# Patient Record
Sex: Female | Born: 2005 | Race: Black or African American | Hispanic: No | Marital: Single | Smoking: Never smoker
Health system: Southern US, Community
[De-identification: ages and names within clinical notes are randomized; demographics above are authoritative.]

## PROBLEM LIST (undated history)

## (undated) DIAGNOSIS — D5700 Hb-SS disease with crisis, unspecified: Secondary | ICD-10-CM

## (undated) HISTORY — PX: APPENDECTOMY: SHX54

## (undated) HISTORY — PX: SPLENECTOMY, TOTAL: SHX788

## (undated) HISTORY — PX: GALLBLADDER SURGERY: SHX652

---
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
## 2023-02-20 ENCOUNTER — Other Ambulatory Visit (HOSPITAL_COMMUNITY)
Admission: RE | Admit: 2023-02-20 | Discharge: 2023-02-20 | Disposition: A | Discharge status: Home / Self Care | Payer: Medicaid Other | Source: Ambulatory Visit | Attending: Family | Admitting: Family

## 2023-02-20 ENCOUNTER — Ambulatory Visit (INDEPENDENT_AMBULATORY_CARE_PROVIDER_SITE_OTHER): Payer: Medicaid Other | Admitting: Family

## 2023-02-20 ENCOUNTER — Encounter: Payer: Self-pay | Admitting: Family

## 2023-02-20 ENCOUNTER — Encounter: Payer: Self-pay | Admitting: Pediatrics

## 2023-02-20 VITALS — BP 112/72 | HR 96 | Ht 61.81 in | Wt 129.2 lb

## 2023-02-20 DIAGNOSIS — Z3202 Encounter for pregnancy test, result negative: Secondary | ICD-10-CM | POA: Diagnosis not present

## 2023-02-20 DIAGNOSIS — Z113 Encounter for screening for infections with a predominantly sexual mode of transmission: Secondary | ICD-10-CM | POA: Insufficient documentation

## 2023-02-20 DIAGNOSIS — Z3046 Encounter for surveillance of implantable subdermal contraceptive: Secondary | ICD-10-CM | POA: Diagnosis not present

## 2023-02-20 DIAGNOSIS — N921 Excessive and frequent menstruation with irregular cycle: Secondary | ICD-10-CM | POA: Diagnosis not present

## 2023-02-20 DIAGNOSIS — Z975 Presence of (intrauterine) contraceptive device: Secondary | ICD-10-CM

## 2023-02-20 LAB — POCT URINE PREGNANCY: Preg Test, Ur: NEGATIVE

## 2023-02-20 MED ORDER — ETONOGESTREL 68 MG ~~LOC~~ IMPL
68.0000 mg | DRUG_IMPLANT | Freq: Once | SUBCUTANEOUS | Status: AC
  Administered 2023-02-20: 68 mg via SUBCUTANEOUS

## 2023-02-20 NOTE — Progress Notes (Signed)
History was provided by the patient. Mother on phone.   Janet Stone is a 17 y.o. female who is here for breakthrough bleeding with implant; requesting removal and replacement. Nexplanon insertion 03/2020.   PCP confirmed? 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   Sickle cell crisis (HCC)    SOB (shortness of breath)    School problem 03/27/2015   Fever, unspecified    Wheezing    Sickle cell disease, type SS (HCC)    Hb-SS disease with crisis (HCC)    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  2   Oxycodone HCl 10 MG TABS Take 1 tablet (10 mg total) by mouth every 6 (six) hours as needed (pain). 20 tablet 0   penicillin v potassium (VEETID) 250 MG tablet Take 250 mg by mouth 2 times daily at 12 noon and 4 pm.     acetaminophen (TYLENOL) 500 MG tablet Take 500 mg by mouth 2 (two) times daily as needed for moderate pain, fever or headache. (Patient not taking: Reported on 02/20/2023)      acetaminophen-codeine (TYLENOL #3) 300-30 MG tablet Take 1 tablet by mouth every 4 (four) hours as needed for moderate pain. (Patient not taking: Reported on 02/20/2023)     amoxicillin-clavulanate (AUGMENTIN) 250-125 MG tablet Take 1 tablet by mouth 3 (three) times daily. (Patient not taking: Reported on 02/20/2023)     ENDARI 5 g PACK Powder Packet Take 5 g by mouth in the morning, at noon, and at bedtime. (Patient not taking: Reported on 06/04/2022) 180 each 11   ibuprofen (ADVIL) 200 MG tablet Take 600 mg by mouth 2 (two) times daily as needed for moderate pain. (Patient not taking: Reported on 02/20/2023)     naloxone (NARCAN) nasal spray 4 mg/0.1 mL Place 1 spray into the nose once. (Patient not taking: Reported on 02/20/2023)     polyethylene glycol (MIRALAX) 17 g packet Take 17 g by mouth 2 (two) times daily. (Patient not taking: Reported on 02/20/2023) 30 each 0   No current facility-administered medications on file prior to visit.    No Known Allergies  Physical Exam:    Vitals:   02/20/23 1011  BP: 112/72  Pulse: 96  Weight: 129 lb 3.2 oz (58.6 kg)  Height: 5' 1.81" (1.57 m)    Blood pressure %iles are not available for patients who are 18 years or older. No LMP recorded.  Physical  Exam Constitutional:      General: She is not in acute distress.    Appearance: She is well-developed.  HENT:     Head: Normocephalic and atraumatic.  Eyes:     General: No scleral icterus.    Pupils: Pupils are equal, round, and reactive to light.  Neck:     Thyroid: No thyromegaly.  Cardiovascular:     Rate and Rhythm: Normal rate and regular rhythm.     Heart sounds: Normal heart sounds. No murmur heard. Pulmonary:     Effort: Pulmonary effort is normal.     Breath sounds: Normal breath sounds.  Abdominal:     Palpations: Abdomen is soft.  Musculoskeletal:        General: Normal range of motion.     Cervical back: Normal range of motion and neck supple.  Lymphadenopathy:      Cervical: No cervical adenopathy.  Skin:    General: Skin is warm and dry.     Findings: No rash.     Comments: New implant palpable in correct position LUE   Neurological:     Mental Status: She is alert and oriented to person, place, and time.     Cranial Nerves: No cranial nerve deficit.  Psychiatric:        Behavior: Behavior normal.        Thought Content: Thought content normal.        Judgment: Judgment normal.      Assessment/Plan: 1. Breakthrough bleeding on Nexplanon (Primary) 2. Encounter for removal and reinsertion of Nexplanon Risks & benefits of Nexplanon removal discussed. Consent form signed.  The patient denies any allergies to anesthetics or antiseptics.  Procedure: Pt was placed in supine position. left arm was flexed at the elbow and externally rotated so that her wrist was parallel to her ear, The device was palpated and marked. The site was cleaned with Betadine. The area surrounding the device was covered with a sterile drape. 1% lidocaine was injected just under the device. A scalpel was used to create a small incision. The device was pushed towards the incision. Fibrous tissue surrounding the device was gradually removed from the device. The device was removed and measured to ensure all 4 cm of device was removed.  Nexplanon Reinsertion % lidocaine was injected just under the skin at the insertion site extending 4 cm proximally. The sterile preloaded disposable Nexaplanon applicator was removed from the sterile packaging The applicator needle was inserted at a 30 degree angle at 8 cm proximal to the medial epicondyle as marked The applicator was lowered to a horizontal position and advanced just under the skin for the full length of the needle The slider on the applicator was retracted fully while the applicator remained in the same position, then the applicator was removed. The implant was confirmed via palpation as being in position The implant  position was demonstrated to the patient Pressure dressing was applied to the patient.  The patient was instructed to removed the pressure dressing in 24 hrs.  The patient was advised to move slowly from a supine to an upright position  The patient denied any concerns or complaints  The patient was instructed to schedule a follow-up appt in 1 month and to call sooner if any concerns.  The patient acknowledged agreement and understanding of the plan.   - etonogestrel (NEXPLANON) implant 68 mg - Subdermal Etonogestrel Implant Insertion  3. Routine screening for STI (sexually transmitted infection) - Urine cytology ancillary only -  WET PREP BY MOLECULAR PROBE  4. Pregnancy examination or test, negative result - POCT urine pregnancy  Follow up in one month, virtual OK.

## 2023-02-20 NOTE — Patient Instructions (Signed)
Follow-up  in 1 month. Schedule this appointment before you leave clinic today.  Congratulations on getting your Nexplanon placement!  Below is some important information about Nexplanon.  First remember that Nexplanon does not prevent sexually transmitted infections.  Condoms will help prevent sexually transmitted infections. The Nexplanon starts working 7 days after it was inserted.  There is a risk of getting pregnant if you have unprotected sex in those first 7 days after placement of the Nexplanon.  The Nexplanon lasts for 3 years but can be removed at any time.  You can become pregnant as early as 1 week after removal.  You can have a new Nexplanon put in after the old one is removed if you like.  It is not known whether Nexplanon is as effective in women who are very overweight because the studies did not include many overweight women.  Nexplanon interacts with some medications, including barbiturates, bosentan, carbamazepine, felbamate, griseofulvin, oxcarbazepine, phenytoin, rifampin, St. John's wort, topiramate, HIV medicines.  Please alert your doctor if you are on any of these medicines.  Always tell other healthcare providers that you have a Nexplanon in your arm.  The Nexplanon was placed just under the skin.  Leave the outside bandage on for 24 hours.  Leave the smaller bandage on for 3-5 days or until it falls off on its own.  Keep the area clean and dry for 3-5 days. There is usually bruising or swelling at the insertion site for a few days to a week after placement.  If you see redness or pus draining from the insertion site, call us immediately.  Keep your user card with the date the implant was placed and the date the implant is to be removed.  The most common side effect is a change in your menstrual bleeding pattern.   This bleeding is generally not harmful to you but can be annoying.  Call or come in to see us if you have any concerns about the bleeding or if you have any  side effects or questions.    We will call you in 1 week to check in and we would like you to return to the clinic for a follow-up visit in 1 month.  You can call [facility] 24 hours a day with any questions or concerns.  There is always a nurse or doctor available to take your call.  Call 9-1-1 if you have a life-threatening emergency.  For anything else, please call us at [phone] before heading to the ER. 

## 2023-02-21 ENCOUNTER — Other Ambulatory Visit: Payer: Self-pay | Admitting: Family

## 2023-02-21 LAB — URINE CYTOLOGY ANCILLARY ONLY
Bacterial Vaginitis-Urine: NEGATIVE
Candida Urine: NEGATIVE
Chlamydia: NEGATIVE
Comment: NEGATIVE
Comment: NEGATIVE
Comment: NORMAL
Neisseria Gonorrhea: NEGATIVE
Trichomonas: POSITIVE — AB

## 2023-02-21 LAB — WET PREP BY MOLECULAR PROBE
Candida species: NOT DETECTED
Gardnerella vaginalis: NOT DETECTED
MICRO NUMBER:: 15871844
SPECIMEN QUALITY:: ADEQUATE
Trichomonas vaginosis: DETECTED — AB

## 2023-02-21 MED ORDER — METRONIDAZOLE 500 MG PO TABS: 2000.0000 mg | ORAL_TABLET | Freq: Once | ORAL | 0 refills | Status: AC

## 2023-03-22 ENCOUNTER — Emergency Department (HOSPITAL_COMMUNITY)
Admission: EM | Admit: 2023-03-22 | Discharge: 2023-03-22 | Disposition: A | Discharge status: Home / Self Care | Payer: Medicaid Other | Attending: Emergency Medicine | Admitting: Emergency Medicine

## 2023-03-22 ENCOUNTER — Emergency Department (HOSPITAL_COMMUNITY): Payer: Medicaid Other

## 2023-03-22 ENCOUNTER — Other Ambulatory Visit: Payer: Self-pay

## 2023-03-22 ENCOUNTER — Encounter (HOSPITAL_COMMUNITY): Payer: Self-pay

## 2023-03-22 DIAGNOSIS — D5700 Hb-SS disease with crisis, unspecified: Secondary | ICD-10-CM

## 2023-03-22 DIAGNOSIS — M545 Low back pain, unspecified: Secondary | ICD-10-CM | POA: Insufficient documentation

## 2023-03-22 DIAGNOSIS — N3001 Acute cystitis with hematuria: Secondary | ICD-10-CM | POA: Diagnosis not present

## 2023-03-22 DIAGNOSIS — D57219 Sickle-cell/Hb-C disease with crisis, unspecified: Secondary | ICD-10-CM | POA: Insufficient documentation

## 2023-03-22 DIAGNOSIS — R0789 Other chest pain: Secondary | ICD-10-CM | POA: Diagnosis present

## 2023-03-22 LAB — COMPREHENSIVE METABOLIC PANEL
ALT: 12 U/L (ref 0–44)
AST: 23 U/L (ref 15–41)
Albumin: 4.7 g/dL (ref 3.5–5.0)
Alkaline Phosphatase: 30 U/L — ABNORMAL LOW (ref 38–126)
Anion gap: 8 (ref 5–15)
BUN: 7 mg/dL (ref 6–20)
CO2: 25 mmol/L (ref 22–32)
Calcium: 9.5 mg/dL (ref 8.9–10.3)
Chloride: 103 mmol/L (ref 98–111)
Creatinine, Ser: 0.64 mg/dL (ref 0.44–1.00)
GFR, Estimated: >60 mL/min (ref >60)
Glucose, Bld: 87 mg/dL (ref 70–99)
Potassium: 3.6 mmol/L (ref 3.5–5.1)
Sodium: 136 mmol/L (ref 135–145)
Total Bilirubin: 2.4 mg/dL — ABNORMAL HIGH (ref 0.0–1.2)
Total Protein: 7.7 g/dL (ref 6.5–8.1)

## 2023-03-22 LAB — URINALYSIS, ROUTINE W REFLEX MICROSCOPIC
Bilirubin Urine: NEGATIVE
Glucose, UA: NEGATIVE mg/dL
Ketones, ur: NEGATIVE mg/dL
Nitrite: POSITIVE — AB
Protein, ur: 30 mg/dL — AB
Specific Gravity, Urine: 1.013 (ref 1.005–1.030)
pH: 6 (ref 5.0–8.0)

## 2023-03-22 LAB — CBC WITH DIFFERENTIAL/PLATELET
Abs Immature Granulocytes: 0.02 10*3/uL (ref 0.00–0.07)
Basophils Absolute: 0 10*3/uL (ref 0.0–0.1)
Basophils Relative: 0 %
Eosinophils Absolute: 0.1 10*3/uL (ref 0.0–0.5)
Eosinophils Relative: 2 %
HCT: 29.8 % — ABNORMAL LOW (ref 36.0–46.0)
Hemoglobin: 10.2 g/dL — ABNORMAL LOW (ref 12.0–15.0)
Immature Granulocytes: 0 %
Lymphocytes Relative: 27 %
Lymphs Abs: 1.9 10*3/uL (ref 0.7–4.0)
MCH: 30.7 pg (ref 26.0–34.0)
MCHC: 34.2 g/dL (ref 30.0–36.0)
MCV: 89.8 fL (ref 80.0–100.0)
Monocytes Absolute: 0.9 10*3/uL (ref 0.1–1.0)
Monocytes Relative: 12 %
Neutro Abs: 4.1 10*3/uL (ref 1.7–7.7)
Neutrophils Relative %: 59 %
Platelets: 417 10*3/uL — ABNORMAL HIGH (ref 150–400)
RBC: 3.32 MIL/uL — ABNORMAL LOW (ref 3.87–5.11)
RDW: 13.7 % (ref 11.5–15.5)
WBC: 7.1 10*3/uL (ref 4.0–10.5)
nRBC: 0 % (ref 0.0–0.2)

## 2023-03-22 LAB — RETICULOCYTES
Immature Retic Fract: 24.9 % — ABNORMAL HIGH (ref 2.3–15.9)
RBC.: 3.28 MIL/uL — ABNORMAL LOW (ref 3.87–5.11)
Retic Count, Absolute: 123 10*3/uL (ref 19.0–186.0)
Retic Ct Pct: 3.8 % — ABNORMAL HIGH (ref 0.4–3.1)

## 2023-03-22 LAB — HCG, SERUM, QUALITATIVE: Preg, Serum: NEGATIVE

## 2023-03-22 MED ORDER — DIPHENHYDRAMINE HCL 25 MG PO CAPS
25.0000 mg | ORAL_CAPSULE | Freq: Once | ORAL | Status: AC
  Administered 2023-03-22: 25 mg via ORAL
  Filled 2023-03-22: qty 1

## 2023-03-22 MED ORDER — ONDANSETRON HCL 4 MG/2ML IJ SOLN
4.0000 mg | INTRAMUSCULAR | Status: DC | PRN
  Filled 2023-03-22: qty 2

## 2023-03-22 MED ORDER — MORPHINE SULFATE (PF) 4 MG/ML IV SOLN: 8.0000 mg | INTRAVENOUS | Status: DC

## 2023-03-22 MED ORDER — HYDROMORPHONE HCL 2 MG/ML IJ SOLN
2.0000 mg | Freq: Once | INTRAMUSCULAR | Status: AC
  Administered 2023-03-22: 2 mg via INTRAVENOUS
  Filled 2023-03-22: qty 1

## 2023-03-22 MED ORDER — SODIUM CHLORIDE 0.9 % IV SOLN
12.5000 mg | Freq: Four times a day (QID) | INTRAVENOUS | Status: DC | PRN
  Administered 2023-03-22: 12.5 mg via INTRAVENOUS
  Filled 2023-03-22: qty 12.5

## 2023-03-22 MED ORDER — MORPHINE SULFATE (PF) 4 MG/ML IV SOLN
6.0000 mg | INTRAVENOUS | Status: AC
  Administered 2023-03-22: 6 mg via INTRAVENOUS
  Filled 2023-03-22: qty 2

## 2023-03-22 MED ORDER — OXYCODONE-ACETAMINOPHEN 5-325 MG PO TABS: 1.0000 | ORAL_TABLET | Freq: Four times a day (QID) | ORAL | 0 refills | Status: DC | PRN

## 2023-03-22 MED ORDER — CEPHALEXIN 500 MG PO CAPS: 500.0000 mg | ORAL_CAPSULE | Freq: Four times a day (QID) | ORAL | 0 refills | Status: DC

## 2023-03-22 NOTE — ED Triage Notes (Signed)
Pt reports with SCC since yesterday with pain to her lower back and chest. Pt last took naproxen at 0800.

## 2023-03-22 NOTE — ED Provider Notes (Signed)
Janet Stone Provider Note   CSN: [id number] Arrival date & time: 03/22/23  1048     History  Chief Complaint  Patient presents with   Sickle Cell Pain Crisis    Janet Stone is a 18 y.o. female with past medical history of sickle cell disease, acute chest syndrome, splenic torsion crisis presenting to emergency room with sickle cell pain crisis that started yesterday.  Patient reports the pain feels like her typical sickle cell pain crisis.  Reports she did not have any injury or fall.  Primarily reporting low back pain, bilaterally.  Pain is worse with movement.  No radicular symptoms, loss of bowel or bladder or saddle anesthesia.  Patient also having chest tightness located in anterior chest worse when taking deep breath then.  Denies any unilateral leg swelling or calf tenderness.  Has not had infectious symptoms or cough, denies fever or chills.  Reports taking oxycodone and naproxen at home for pain control, without improvement.   Sickle Cell Pain Crisis Associated symptoms: chest pain        Home Medications Prior to Admission medications   Medication Sig Start Date End Date Taking? Authorizing Provider  acetaminophen (TYLENOL) 500 MG tablet Take 500 mg by mouth 2 (two) times daily as needed for moderate pain, fever or headache. Patient not taking: Reported on 02/20/2023    [provider]  acetaminophen-codeine (TYLENOL #3) 300-30 MG tablet Take 1 tablet by mouth every 4 (four) hours as needed for moderate pain. Patient not taking: Reported on 02/20/2023 11/21/22   [provider]  amoxicillin-clavulanate (AUGMENTIN) 250-125 MG tablet Take 1 tablet by mouth 3 (three) times daily. Patient not taking: Reported on 02/20/2023 11/20/22   [provider]  ENDARI 5 g PACK Powder Packet Take 5 g by mouth in the morning, at noon, and at bedtime. Patient not taking: Reported on 06/04/2022 04/24/19   Anastasia Pall, MD  hydroxyurea (HYDREA) 500 MG capsule Take 500 mg by mouth 2 (two) times daily. 06/13/17   [provider]  ibuprofen (ADVIL) 200 MG tablet Take 600 mg by mouth 2 (two) times daily as needed for moderate pain. Patient not taking: Reported on 02/20/2023    [provider]  naloxone [facility]) nasal spray 4 mg/0.1 mL Place 1 spray into the nose once. Patient not taking: Reported on 02/20/2023 11/15/22   [provider]  Oxycodone HCl 10 MG TABS Take 1 tablet (10 mg total) by mouth every 6 (six) hours as needed (pain). 12/02/22   Massie Maroon, FNP  penicillin v potassium (VEETID) 250 MG tablet Take 250 mg by mouth 2 times daily at 12 noon and 4 pm. 04/10/16   [provider]  polyethylene glycol (MIRALAX) 17 g packet Take 17 g by mouth 2 (two) times daily. Patient not taking: Reported on 02/20/2023 06/09/22   Krystal Clark, MD      Allergies    Patient has no known allergies.    Review of Systems   Review of Systems  Cardiovascular:  Positive for chest pain.    Physical Exam Updated Vital Signs BP 117/73 (BP Location: Left Arm)   Pulse (!) 106   Temp 98.4 F (36.9 C) (Oral)   Resp 16   Ht 5\' 2"  (1.575 m)   Wt 58.6 kg   SpO2 100%   BMI 23.63 kg/m  Physical Exam Vitals and nursing note reviewed.  Constitutional:      General:  She is not in acute distress.    Appearance: She is not toxic-appearing.  HENT:     Head: Normocephalic and atraumatic.  Eyes:     General: No scleral icterus.    Conjunctiva/sclera: Conjunctivae normal.  Cardiovascular:     Rate and Rhythm: Normal rate and regular rhythm.     Pulses: Normal pulses.     Heart sounds: Normal heart sounds.  Pulmonary:     Effort: Pulmonary effort is normal. No respiratory distress.     Breath sounds: Normal breath sounds.  Abdominal:     General: Abdomen is flat. Bowel sounds are normal.     Palpations: Abdomen is soft.     Tenderness: There is no abdominal tenderness.   Musculoskeletal:     Right lower leg: No edema.     Left lower leg: No edema.  Skin:    General: Skin is warm and dry.     Findings: No lesion.  Neurological:     General: No focal deficit present.     Mental Status: She is alert and oriented to person, place, and time. Mental status is at baseline.     ED Results / Procedures / Treatments   Labs (all labs ordered are listed, but only abnormal results are displayed) Labs Reviewed  COMPREHENSIVE METABOLIC PANEL  CBC WITH DIFFERENTIAL/PLATELET  RETICULOCYTES  HCG, SERUM, QUALITATIVE  URINALYSIS, ROUTINE W REFLEX MICROSCOPIC    EKG None  Radiology No results found.  Procedures Procedures    Medications Ordered in ED Medications  ondansetron (ZOFRAN) injection 4 mg (has no administration in time range)  diphenhydrAMINE (BENADRYL) 12.5 mg in sodium chloride 0.9 % 50 mL IVPB (has no administration in time range)  morphine (PF) 4 MG/ML injection 6 mg (has no administration in time range)    ED Course/ Medical Decision Making/ A&P                                 Medical Decision Making Amount and/or Complexity of Data Reviewed Labs: ordered. Radiology: ordered.  Risk Prescription drug management.   Janet Stone 18 y.o. presented today for chest pain. Working DDx that I considered at this time includes, but not limited to, ACS, GERD, pe, pna, aortic dissection, pneumothorax, MSK path, anemia, esophageal rupture, CHF exacerbation, valvular disorder, myocarditis, pericarditis, endocarditis, pericardial effusion/cardiac tamponade, pulmonary edema, gastritis/PUD, esophagitis.  R/o Dx: These are considered less likely due to history of present illness and physical exam findings.  Review of prior external notes: None   Unique Tests and My Interpretation:  EKG: Rate, rhythm, axis, intervals all examined: sinus tachycardia   CXR: no acute findings  CBC: No leukocytosis, hemoglobin stable at 10.2 BMP: No electrolyte  abnormality HCG: Negative Reticulocyte: No significant findings UA: Positive nitrates, positive leukocytes, bacteria  Problem List / ED Course / Critical interventions / Medication management  Reporting to emergency room with complaint of chest tightness and low back pain.  Patient does also have some suprapubic pain.  Ordered urinalysis to evaluate for urinary like symptoms which shows urinary tract infection.  Patient's chest tightness and low back pain feels like typical sickle cell pain.  Patient is PERC negative.  EKG without acute abnormality.  Chest x-ray without acute abnormality.  No infectious symptoms hemodynamically stable and not hypoxic.  Pain has improved after IV pain medication.  And patient is comfortable going home and using oral pain medications.  Lab  work consistent with prior labs.  Patient has follow-up with his sickle cell team. Stable for discharge.  I ordered medication including Morphine, Nausea  for pain  Reevaluation of the patient after these medicines showed that the patient improved Patients vitals assessed. Upon arrival patient is hemodynamically stable.  I have reviewed the patients home medicines and have made adjustments as needed     Plan: UTI treatment with antibiotics Follow-up with primary care, follow-up with sickle cell disease Stable for discharge         Final Clinical Impression(s) / ED Diagnoses Final diagnoses:  Sickle cell pain crisis (HCC)  Acute cystitis with hematuria    Rx / DC Orders ED Discharge Orders     None         Smitty Knudsen, PA-C 03/22/23 1820    Loetta Rough, MD 03/23/23 [phone]

## 2023-03-22 NOTE — Discharge Instructions (Addendum)
You were seen in the emergency room today for sickle cell pain crisis.  Your EKG, chest x-ray and labs are reassuring.  Please follow-up with your primary care doctor to ensure symptoms are improving.  Please return to emergency room if you have any any new or worsening symptoms.  Your urinalysis does show urinary tract infection thus I sent Keflex which is an antibiotic.  Please make sure you are staying well-hydrated.

## 2023-03-24 ENCOUNTER — Encounter: Payer: Self-pay | Admitting: Family

## 2023-03-24 ENCOUNTER — Telehealth: Payer: Medicaid Other | Admitting: Family

## 2023-03-24 DIAGNOSIS — Z975 Presence of (intrauterine) contraceptive device: Secondary | ICD-10-CM

## 2023-03-24 NOTE — Progress Notes (Signed)
Link sent to number on file x 15 minutes; patient not seen. Closed for admin purposes.

## 2023-04-10 ENCOUNTER — Encounter: Payer: Medicaid Other | Admitting: Family

## 2023-04-15 ENCOUNTER — Encounter: Payer: Medicaid Other | Admitting: Family

## 2023-04-16 ENCOUNTER — Other Ambulatory Visit: Payer: Self-pay

## 2023-04-16 ENCOUNTER — Emergency Department (HOSPITAL_COMMUNITY)
Admission: EM | Admit: 2023-04-16 | Discharge: 2023-04-16 | Disposition: A | Discharge status: Home / Self Care | Payer: Medicaid Other | Attending: Student | Admitting: Student

## 2023-04-16 ENCOUNTER — Encounter (HOSPITAL_COMMUNITY): Payer: Self-pay

## 2023-04-16 DIAGNOSIS — Z8616 Personal history of COVID-19: Secondary | ICD-10-CM | POA: Insufficient documentation

## 2023-04-16 DIAGNOSIS — D5700 Hb-SS disease with crisis, unspecified: Secondary | ICD-10-CM | POA: Insufficient documentation

## 2023-04-16 LAB — RETICULOCYTES
Immature Retic Fract: 22.7 % — ABNORMAL HIGH (ref 2.3–15.9)
RBC.: 3.37 MIL/uL — ABNORMAL LOW (ref 3.87–5.11)
Retic Count, Absolute: 124.7 10*3/uL (ref 19.0–186.0)
Retic Ct Pct: 3.7 % — ABNORMAL HIGH (ref 0.4–3.1)

## 2023-04-16 LAB — CBC WITH DIFFERENTIAL/PLATELET
Abs Immature Granulocytes: 0.03 10*3/uL (ref 0.00–0.07)
Basophils Absolute: 0 10*3/uL (ref 0.0–0.1)
Basophils Relative: 0 %
Eosinophils Absolute: 0.2 10*3/uL (ref 0.0–0.5)
Eosinophils Relative: 2 %
HCT: 29.5 % — ABNORMAL LOW (ref 36.0–46.0)
Hemoglobin: 10.4 g/dL — ABNORMAL LOW (ref 12.0–15.0)
Immature Granulocytes: 0 %
Lymphocytes Relative: 25 %
Lymphs Abs: 1.9 10*3/uL (ref 0.7–4.0)
MCH: 31.5 pg (ref 26.0–34.0)
MCHC: 35.3 g/dL (ref 30.0–36.0)
MCV: 89.4 fL (ref 80.0–100.0)
Monocytes Absolute: 0.7 10*3/uL (ref 0.1–1.0)
Monocytes Relative: 10 %
Neutro Abs: 4.7 10*3/uL (ref 1.7–7.7)
Neutrophils Relative %: 63 %
Platelets: 423 10*3/uL — ABNORMAL HIGH (ref 150–400)
RBC: 3.3 MIL/uL — ABNORMAL LOW (ref 3.87–5.11)
RDW: 14 % (ref 11.5–15.5)
WBC: 7.5 10*3/uL (ref 4.0–10.5)
nRBC: 0 % (ref 0.0–0.2)

## 2023-04-16 LAB — COMPREHENSIVE METABOLIC PANEL
ALT: 12 U/L (ref 0–44)
AST: 23 U/L (ref 15–41)
Albumin: 5.1 g/dL — ABNORMAL HIGH (ref 3.5–5.0)
Alkaline Phosphatase: 32 U/L — ABNORMAL LOW (ref 38–126)
Anion gap: 7 (ref 5–15)
BUN: 7 mg/dL (ref 6–20)
CO2: 24 mmol/L (ref 22–32)
Calcium: 9.7 mg/dL (ref 8.9–10.3)
Chloride: 106 mmol/L (ref 98–111)
Creatinine, Ser: 0.5 mg/dL (ref 0.44–1.00)
GFR, Estimated: >60 mL/min (ref >60)
Glucose, Bld: 90 mg/dL (ref 70–99)
Potassium: 3.9 mmol/L (ref 3.5–5.1)
Sodium: 137 mmol/L (ref 135–145)
Total Bilirubin: 2 mg/dL — ABNORMAL HIGH (ref 0.0–1.2)
Total Protein: 8.1 g/dL (ref 6.5–8.1)

## 2023-04-16 LAB — HCG, SERUM, QUALITATIVE: Preg, Serum: NEGATIVE

## 2023-04-16 MED ORDER — ONDANSETRON HCL 4 MG/2ML IJ SOLN
4.0000 mg | Freq: Once | INTRAMUSCULAR | Status: AC
Start: 2023-04-16 — End: 2023-04-16
  Administered 2023-04-16: 4 mg via INTRAVENOUS
  Filled 2023-04-16: qty 2

## 2023-04-16 MED ORDER — LACTATED RINGERS IV BOLUS
1000.0000 mL | Freq: Once | INTRAVENOUS | Status: AC
  Administered 2023-04-16: 1000 mL via INTRAVENOUS

## 2023-04-16 MED ORDER — KETOROLAC TROMETHAMINE 15 MG/ML IJ SOLN
15.0000 mg | Freq: Once | INTRAMUSCULAR | Status: AC
  Administered 2023-04-16: 15 mg via INTRAVENOUS
  Filled 2023-04-16: qty 1

## 2023-04-16 MED ORDER — HYDROMORPHONE HCL 1 MG/ML IJ SOLN
1.0000 mg | Freq: Once | INTRAMUSCULAR | Status: AC
  Administered 2023-04-16: 1 mg via INTRAVENOUS
  Filled 2023-04-16: qty 1

## 2023-04-16 MED ORDER — DIPHENHYDRAMINE HCL 50 MG/ML IJ SOLN
25.0000 mg | Freq: Once | INTRAMUSCULAR | Status: AC
  Administered 2023-04-16: 25 mg via INTRAVENOUS
  Filled 2023-04-16: qty 1

## 2023-04-16 MED ORDER — OXYCODONE HCL 5 MG PO CAPS: 10.0000 mg | ORAL_CAPSULE | Freq: Four times a day (QID) | ORAL | 0 refills | Status: DC | PRN

## 2023-04-16 MED ORDER — MORPHINE SULFATE (PF) 4 MG/ML IV SOLN
6.0000 mg | Freq: Once | INTRAVENOUS | Status: AC
  Administered 2023-04-16: 6 mg via INTRAVENOUS
  Filled 2023-04-16: qty 2

## 2023-04-16 MED ORDER — IBUPROFEN 600 MG PO TABS: 600.0000 mg | ORAL_TABLET | Freq: Three times a day (TID) | ORAL | 0 refills | Status: AC | PRN

## 2023-04-16 NOTE — ED Provider Notes (Signed)
[place] EMERGENCY DEPARTMENT AT [facility] Provider Note  CSN: [id number] Arrival date & time: 04/16/23 1112  Chief Complaint(s) No chief complaint on file.  HPI Janet Stone is a 18 y.o. female with PMH sickle cell anemia who presents Emergency Department for evaluation of arm pain and sickle cell pain crisis.  States that pain began last night.  Has been compliant with her home oxycodone without improvement.  Denies associated chest pain, shortness of breath, abdominal pain, nausea, vomiting or other systemic symptoms.   Past Medical History Past Medical History:  Diagnosis Date   Sickle cell anemia with crisis [facility])    Patient Active Problem List   Diagnosis Date Noted   Hyperkalemia 11/26/2022   Sickle cell anemia (HCC) 04/12/2020   Constipation due to opioid therapy 04/12/2020   COVID-19 04/23/2019   COVID-19 virus infection 04/23/2019   Pain of left lower extremity    S/P cholecystectomy 12/02/2015   S/P appendectomy 12/02/2015   Oxygen desaturation    Acute sickle cell splenic sequestration crisis (HCC)    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   Sickle cell crisis (HCC)    SOB (shortness of breath)    School problem 03/27/2015   Fever, unspecified    Wheezing    Sickle cell disease, type SS (HCC)    Hb-SS disease with crisis (HCC)    Splenic sequestration crisis 08/11/2014   Hypoxia    Fever    Acute chest syndrome (HCC)    Adjustment disorder with other symptom    Pica 12/24/2013   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 Authorizing Provider  acetaminophen (TYLENOL) 500 MG tablet Take 500 mg by mouth 2 (two) times daily as needed for moderate pain, fever or headache. Patient not taking: Reported on 02/20/2023    [provider]  acetaminophen-codeine (TYLENOL #3) 300-30 MG tablet Take 1 tablet by mouth every 4 (four) hours as needed for moderate pain. Patient not taking: Reported on 02/20/2023 11/21/22   [provider]  amoxicillin-clavulanate (AUGMENTIN) 250-125 MG tablet Take 1 tablet by mouth 3 (three) times daily. Patient not taking: Reported on 02/20/2023 11/20/22   [provider]  cephALEXin (KEFLEX) 500 MG capsule Take 1 capsule (500 mg total) by mouth 4 (four) times daily. 03/22/23   Barrett, Horald Chestnut, PA-C  ENDARI 5 g PACK Powder Packet Take 5 g by mouth in the morning, at noon, and at bedtime. Patient not taking: Reported on 06/04/2022 04/24/19   Anastasia Pall, MD  hydroxyurea (HYDREA) 500 MG capsule Take 500 mg by mouth 2 (two) times daily. 06/13/17   [provider]  ibuprofen (ADVIL) 200 MG tablet Take 600 mg by mouth 2 (two) times daily as needed for moderate pain. Patient not taking: Reported on 02/20/2023    [provider]  naloxone [facility]) nasal spray 4 mg/0.1 mL Place 1 spray into the nose once. Patient not taking: Reported on 02/20/2023 11/15/22   [provider]  Oxycodone HCl 10 MG TABS Take 1 tablet (10 mg total) by mouth every 6 (six) hours as needed (pain). 12/02/22   Massie Maroon, FNP  oxyCODONE-acetaminophen (PERCOCET/ROXICET) 5-325 MG tablet Take 1 tablet by mouth every 6 (six) hours as needed for severe pain (pain score 7-10). 03/22/23   Barrett,  Horald Chestnut, PA-C  penicillin v potassium (VEETID) 250 MG tablet Take 250 mg by mouth 2 times daily at 12 noon and 4 pm. 04/10/16   [provider]  polyethylene glycol (MIRALAX) 17 g packet Take 17 g by mouth 2 (two) times daily. Patient not taking: Reported on 02/20/2023 06/09/22   Krystal Clark, MD                                                                                                                                    Past Surgical History Past Surgical History:  Procedure Laterality Date    APPENDECTOMY     GALLBLADDER SURGERY     SPLENECTOMY, TOTAL     Family History Family History  Problem Relation Age of Onset   Sickle cell trait Mother    Sickle cell trait Father    Asthma Brother        multiple admits to ED, given nebulizer, sent home, no home meds    Social History Social History   Tobacco Use   Smoking status: Never    Passive exposure: Never  Vaping Use   Vaping status: Never Used  Substance Use Topics   Alcohol use: No   Drug use: No   Allergies Patient has no known allergies.  Review of Systems Review of Systems  Musculoskeletal:  Positive for arthralgias and myalgias.    Physical Exam Vital Signs  I have reviewed the triage vital signs BP (!) 119/57 (BP Location: Left Arm)   Pulse 91   Temp 98.7 F (37.1 C) (Oral)   Resp 16   Ht 5\' 2"  (1.575 m)   Wt 58.6 kg   LMP 03/22/2023 (Approximate)   SpO2 100%   BMI 23.63 kg/m   Physical Exam Vitals and nursing note reviewed.  Constitutional:      General: She is not in acute distress.    Appearance: She is well-developed.  HENT:     Head: Normocephalic and atraumatic.  Eyes:     Conjunctiva/sclera: Conjunctivae normal.  Cardiovascular:     Rate and Rhythm: Normal rate and regular rhythm.     Heart sounds: No murmur heard. Pulmonary:     Effort: Pulmonary effort is normal. No respiratory distress.     Breath sounds: Normal breath sounds.  Abdominal:     Palpations: Abdomen is soft.     Tenderness: There is no abdominal tenderness.  Musculoskeletal:        General: Tenderness present. No swelling.     Cervical back: Neck supple.  Skin:    General: Skin is warm and dry.     Capillary Refill: Capillary refill takes less than 2 seconds.  Neurological:     Mental Status: She is alert.  Psychiatric:        Mood and Affect: Mood normal.     ED Results and Treatments Labs (all labs ordered are listed, but only abnormal  results are displayed) Labs Reviewed  CBC WITH  DIFFERENTIAL/PLATELET - Abnormal; Notable for the following components:      Result Value   RBC 3.30 (*)    Hemoglobin 10.4 (*)    HCT 29.5 (*)    Platelets 423 (*)    All other components within normal limits  RETICULOCYTES - Abnormal; Notable for the following components:   Retic Ct Pct 3.7 (*)    RBC. 3.37 (*)    Immature Retic Fract 22.7 (*)    All other components within normal limits  COMPREHENSIVE METABOLIC PANEL - Abnormal; Notable for the following components:   Albumin 5.1 (*)    Alkaline Phosphatase 32 (*)    Total Bilirubin 2.0 (*)    All other components within normal limits  HCG, SERUM, QUALITATIVE                                                                                                                          Radiology No results found.  Pertinent labs & imaging results that were available during my care of the patient were reviewed by me and considered in my medical decision making (see MDM for details).  Medications Ordered in ED Medications  HYDROmorphone (DILAUDID) injection 1 mg (1 mg Intravenous Given 04/16/23 1310)  ketorolac (TORADOL) 15 MG/ML injection 15 mg (15 mg Intravenous Given 04/16/23 1309)  diphenhydrAMINE (BENADRYL) injection 25 mg (25 mg Intravenous Given 04/16/23 1309)  lactated ringers bolus 1,000 mL (1,000 mLs Intravenous New Bag/Given 04/16/23 1310)                                                                                                                                     Procedures Procedures  (including critical care time)  Medical Decision Making / ED Course   This patient presents to the ED for concern of arm pain, sickle cell, this involves an extensive number of treatment options, and is a complaint that carries with it a high risk of complications and morbidity.  The differential diagnosis includes sickle cell pain crisis, chronic pain, opioid addiction, aplastic crisis  MDM: Patient seen emergency room for evaluation  of myalgias in the setting of sickle cell pain.  Physical exam with some mild tenderness over multiple muscle groups but is otherwise unremarkable.  Laboratory evaluation with a hemoglobin of 10.4, total bili 2.0 but is otherwise unremarkable.  No evidence  of aplastic crisis.  Pregnancy negative.  Patient given multiple rounds of Dilaudid as well as multimodal pain control and is currently on her third round of pain medicine with morphine per patient request.  At time of signout, patient pending reevaluation by oncoming provider.  Please see provider signout note for continuation of workup.   Additional history obtained:  -External records from outside source obtained and reviewed including: Chart review including previous notes, labs, imaging, consultation notes   Lab Tests: -I ordered, reviewed, and interpreted labs.   The pertinent results include:   Labs Reviewed  CBC WITH DIFFERENTIAL/PLATELET - Abnormal; Notable for the following components:      Result Value   RBC 3.30 (*)    Hemoglobin 10.4 (*)    HCT 29.5 (*)    Platelets 423 (*)    All other components within normal limits  RETICULOCYTES - Abnormal; Notable for the following components:   Retic Ct Pct 3.7 (*)    RBC. 3.37 (*)    Immature Retic Fract 22.7 (*)    All other components within normal limits  COMPREHENSIVE METABOLIC PANEL - Abnormal; Notable for the following components:   Albumin 5.1 (*)    Alkaline Phosphatase 32 (*)    Total Bilirubin 2.0 (*)    All other components within normal limits  HCG, SERUM, QUALITATIVE     Medicines ordered and prescription drug management: Meds ordered this encounter  Medications   HYDROmorphone (DILAUDID) injection 1 mg   ketorolac (TORADOL) 15 MG/ML injection 15 mg   diphenhydrAMINE (BENADRYL) injection 25 mg   lactated ringers bolus 1,000 mL    -I have reviewed the patients home medicines and have made adjustments as needed  Critical interventions none    Cardiac  Monitoring: The patient was maintained on a cardiac monitor.  I personally viewed and interpreted the cardiac monitored which showed an underlying rhythm of: NSR  Social Determinants of Health:  Factors impacting patients care include: none   Reevaluation: After the interventions noted above, I reevaluated the patient and found that they have :improved  Co morbidities that complicate the patient evaluation  Past Medical History:  Diagnosis Date   Sickle cell anemia with crisis (HCC)       Dispostion: I considered admission for this patient, and disposition pending reevaluation by oncoming provider.  Please see provider signout note for continuation of workup.     Final Clinical Impression(s) / ED Diagnoses Final diagnoses:  None     @PCDICTATION @    Glendora Score, MD 04/16/23 1718

## 2023-04-16 NOTE — Discharge Instructions (Addendum)
Follow-up with your sickle cell doctor.  We did write for a couple of days of pain medications until you can see your doctor for refill.  Recurrent opiate prescriptions in the future will need to come from your sickle cell doctors that see you regularly.

## 2023-04-16 NOTE — ED Provider Triage Note (Signed)
Emergency Medicine Provider Triage Evaluation Note  Janet Stone , a 18 y.o. female  was evaluated in triage.  Pt complains of right arm pain since yesterday.  States this is consistent with her sickle cell pain.  She took an oxycodone and naproxen which did not help with her pain.  Denies any chest pain or shortness of breath..  Review of Systems  Positive: As above Negative: As above  Physical Exam  BP (!) 119/57 (BP Location: Left Arm)   Pulse 91   Temp 98.7 F (37.1 C) (Oral)   Resp 16   Ht 5\' 2"  (1.575 m)   Wt 58.6 kg   LMP 03/22/2023 (Approximate)   SpO2 100%   BMI 23.63 kg/m  Gen:   Awake, no distress   Resp:  Normal effort  MSK:   Moves extremities without difficulty    Medical Decision Making  Medically screening exam initiated at 12:03 PM.  Appropriate orders placed.  [name] was informed that the remainder of the evaluation will be completed by another provider, this initial triage assessment does not replace that evaluation, and the importance of remaining in the ED until their evaluation is complete.     Arabella Merles, PA-C 04/16/23 1204

## 2023-04-16 NOTE — ED Triage Notes (Signed)
Pt arrived reporting SCC, Right arm. States the pain started last night. Took at home meds, no relief. No other symptoms  reported

## 2023-04-16 NOTE — ED Provider Notes (Signed)
Patient was initially seen by Dr. Posey Rea.  Patient states she is feeling better now and ready for discharge.  Patient is requesting a prescription for her opiate pain medications.  States if she does not get a refill she will need to be admitted.  I did review the PDMP database.  Patient's last prescription was in January and was just for a few days.  I encouraged her to follow-up with her sickle cell doctor for continued outpatient prescriptions   Linwood Dibbles, MD 04/16/23 1646

## 2023-04-21 ENCOUNTER — Encounter: Payer: Self-pay | Admitting: Family

## 2023-05-04 ENCOUNTER — Emergency Department (HOSPITAL_COMMUNITY)

## 2023-05-04 ENCOUNTER — Emergency Department (HOSPITAL_COMMUNITY)
Admission: EM | Admit: 2023-05-04 | Discharge: 2023-05-04 | Disposition: A | Discharge status: Home / Self Care | Attending: Emergency Medicine | Admitting: Emergency Medicine

## 2023-05-04 ENCOUNTER — Other Ambulatory Visit: Payer: Self-pay

## 2023-05-04 DIAGNOSIS — R0981 Nasal congestion: Secondary | ICD-10-CM | POA: Insufficient documentation

## 2023-05-04 DIAGNOSIS — J3489 Other specified disorders of nose and nasal sinuses: Secondary | ICD-10-CM | POA: Diagnosis not present

## 2023-05-04 DIAGNOSIS — M791 Myalgia, unspecified site: Secondary | ICD-10-CM | POA: Insufficient documentation

## 2023-05-04 DIAGNOSIS — J029 Acute pharyngitis, unspecified: Secondary | ICD-10-CM | POA: Insufficient documentation

## 2023-05-04 DIAGNOSIS — B349 Viral infection, unspecified: Secondary | ICD-10-CM

## 2023-05-04 LAB — COMPREHENSIVE METABOLIC PANEL
ALT: 11 U/L (ref 0–44)
AST: 22 U/L (ref 15–41)
Albumin: 4 g/dL (ref 3.5–5.0)
Alkaline Phosphatase: 31 U/L — ABNORMAL LOW (ref 38–126)
Anion gap: 8 (ref 5–15)
BUN: <5 mg/dL — ABNORMAL LOW (ref 6–20)
CO2: 23 mmol/L (ref 22–32)
Calcium: 9.2 mg/dL (ref 8.9–10.3)
Chloride: 107 mmol/L (ref 98–111)
Creatinine, Ser: 0.42 mg/dL — ABNORMAL LOW (ref 0.44–1.00)
GFR, Estimated: >60 mL/min (ref >60)
Glucose, Bld: 83 mg/dL (ref 70–99)
Potassium: 3.8 mmol/L (ref 3.5–5.1)
Sodium: 138 mmol/L (ref 135–145)
Total Bilirubin: 2 mg/dL — ABNORMAL HIGH (ref 0.0–1.2)
Total Protein: 7 g/dL (ref 6.5–8.1)

## 2023-05-04 LAB — CBC WITH DIFFERENTIAL/PLATELET
Abs Immature Granulocytes: 0.08 10*3/uL — ABNORMAL HIGH (ref 0.00–0.07)
Basophils Absolute: 0 10*3/uL (ref 0.0–0.1)
Basophils Relative: 0 %
Eosinophils Absolute: 0.2 10*3/uL (ref 0.0–0.5)
Eosinophils Relative: 1 %
HCT: 27.3 % — ABNORMAL LOW (ref 36.0–46.0)
Hemoglobin: 9.4 g/dL — ABNORMAL LOW (ref 12.0–15.0)
Immature Granulocytes: 1 %
Lymphocytes Relative: 20 %
Lymphs Abs: 3.1 10*3/uL (ref 0.7–4.0)
MCH: 30.8 pg (ref 26.0–34.0)
MCHC: 34.4 g/dL (ref 30.0–36.0)
MCV: 89.5 fL (ref 80.0–100.0)
Monocytes Absolute: 1.2 10*3/uL — ABNORMAL HIGH (ref 0.1–1.0)
Monocytes Relative: 7 %
Neutro Abs: 11.3 10*3/uL — ABNORMAL HIGH (ref 1.7–7.7)
Neutrophils Relative %: 71 %
Platelets: 368 10*3/uL (ref 150–400)
RBC: 3.05 MIL/uL — ABNORMAL LOW (ref 3.87–5.11)
RDW: 14.2 % (ref 11.5–15.5)
WBC: 15.9 10*3/uL — ABNORMAL HIGH (ref 4.0–10.5)
nRBC: 0 % (ref 0.0–0.2)

## 2023-05-04 LAB — RETICULOCYTES
Immature Retic Fract: 26.6 % — ABNORMAL HIGH (ref 2.3–15.9)
RBC.: 2.99 MIL/uL — ABNORMAL LOW (ref 3.87–5.11)
Retic Count, Absolute: 136 10*3/uL (ref 19.0–186.0)
Retic Ct Pct: 4.6 % — ABNORMAL HIGH (ref 0.4–3.1)

## 2023-05-04 LAB — RESP PANEL BY RT-PCR (RSV, FLU A&B, COVID)  RVPGX2
Influenza A by PCR: NEGATIVE
Influenza B by PCR: NEGATIVE
Resp Syncytial Virus by PCR: NEGATIVE
SARS Coronavirus 2 by RT PCR: NEGATIVE

## 2023-05-04 LAB — GROUP A STREP BY PCR: Group A Strep by PCR: NOT DETECTED

## 2023-05-04 LAB — HCG, QUANTITATIVE, PREGNANCY: hCG, Beta Chain, Quant, S: <1 m[IU]/mL (ref <5)

## 2023-05-04 MED ORDER — KETOROLAC TROMETHAMINE 15 MG/ML IJ SOLN
15.0000 mg | Freq: Once | INTRAMUSCULAR | Status: AC
  Administered 2023-05-04: 15 mg via INTRAVENOUS
  Filled 2023-05-04: qty 1

## 2023-05-04 MED ORDER — ACETAMINOPHEN 325 MG PO TABS
650.0000 mg | ORAL_TABLET | Freq: Once | ORAL | Status: AC
  Administered 2023-05-04: 650 mg via ORAL
  Filled 2023-05-04: qty 2

## 2023-05-04 MED ORDER — OXYCODONE-ACETAMINOPHEN 5-325 MG PO TABS
1.0000 | ORAL_TABLET | Freq: Once | ORAL | Status: AC
  Administered 2023-05-04: 1 via ORAL
  Filled 2023-05-04: qty 1

## 2023-05-04 MED ORDER — ACETAMINOPHEN 500 MG PO TABS: 1000.0000 mg | ORAL_TABLET | Freq: Three times a day (TID) | ORAL | 0 refills | Status: AC | PRN

## 2023-05-04 MED ORDER — BENZONATATE 100 MG PO CAPS: 100.0000 mg | ORAL_CAPSULE | Freq: Three times a day (TID) | ORAL | 0 refills | Status: DC

## 2023-05-04 NOTE — Discharge Instructions (Signed)
 Your symptoms likely due to a viral illness.  Please continue to take your home medication for symptom control.  You may alternate between Tylenol and ibuprofen as needed for aches and pain.  Take cough medication prescribed as needed for cough.  Follow-up closely with your doctor for further care.  Return if you have any concern.

## 2023-05-04 NOTE — ED Triage Notes (Signed)
 Pt present POV c/o runny nose and severe body aches onset of 5am, states "it feels different from my sickle cell pain" denies sick contacts, VSS

## 2023-05-04 NOTE — ED Provider Notes (Signed)
 Received signout from previous provider, please see her note for complete H&P.  18 year old female significant history of sickle cell disease presenting today with cold symptoms that started earlier this morning.  Symptoms including body aches, rhinorrhea, along with runny nose and sore throat.  She states it felt different from her usual sickle cell pain.  -Labs ordered, independently viewed and interpreted by me.  Labs remarkable for WBC 15.9.  Hgb 9.4.  retic ct 4.6.  strep negative, covid/flu/rsv negative -The patient was maintained on a cardiac monitor.  I personally viewed and interpreted the cardiac monitored which showed an underlying rhythm of: NSR -Imaging independently viewed and interpreted by me and I agree with radiologist's interpretation.  Result remarkable for CXR unremarkable -This patient presents to the ED for concern of cold sxs, this involves an extensive number of treatment options, and is a complaint that carries with it a high risk of complications and morbidity.  The differential diagnosis includes covid, flu, rsv, pna, viral illness, sickle cell crisis -Co morbidities that complicate the patient evaluation includes sickle cell disease -Treatment includes toradol, tylenol, percocet -Reevaluation of the patient after these medicines showed that the patient improved -PCP office notes or outside notes reviewed -Escalation to admission/observation considered: patients feels much better, is comfortable with discharge, and will follow up with PCP -Prescription medication considered, patient comfortable with home pain medication -Social Determinant of Health considered   BP 112/75   Pulse 93   Temp 98.6 F (37 C)   Ht 5\' 2"  (1.575 m)   Wt 58.6 kg   LMP 05/04/2023   SpO2 100%   BMI 23.63 kg/m   Results for orders placed or performed during the hospital encounter of 05/04/23  Resp panel by RT-PCR (RSV, Flu A&B, Covid) Anterior Nasal Swab   Collection Time: 05/04/23 11:55  AM   Specimen: Anterior Nasal Swab  Result Value Ref Range   SARS Coronavirus 2 by RT PCR NEGATIVE NEGATIVE   Influenza A by PCR NEGATIVE NEGATIVE   Influenza B by PCR NEGATIVE NEGATIVE   Resp Syncytial Virus by PCR NEGATIVE NEGATIVE  Group A Strep by PCR   Collection Time: 05/04/23  1:12 PM  Result Value Ref Range   Group A Strep by PCR NOT DETECTED NOT DETECTED  Comprehensive metabolic panel   Collection Time: 05/04/23  2:20 PM  Result Value Ref Range   Sodium 138 135 - 145 mmol/L   Potassium 3.8 3.5 - 5.1 mmol/L   Chloride 107 98 - 111 mmol/L   CO2 23 22 - 32 mmol/L   Glucose, Bld 83 70 - 99 mg/dL   BUN <5 (L) 6 - 20 mg/dL   Creatinine, Ser 4.09 (L) 0.44 - 1.00 mg/dL   Calcium 9.2 8.9 - 81.1 mg/dL   Total Protein 7.0 6.5 - 8.1 g/dL   Albumin 4.0 3.5 - 5.0 g/dL   AST 22 15 - 41 U/L   ALT 11 0 - 44 U/L   Alkaline Phosphatase 31 (L) 38 - 126 U/L   Total Bilirubin 2.0 (H) 0.0 - 1.2 mg/dL   GFR, Estimated >91 >47 mL/min   Anion gap 8 5 - 15  hCG, quantitative, pregnancy   Collection Time: 05/04/23  2:20 PM  Result Value Ref Range   hCG, Beta Chain, Quant, S <1 <5 mIU/mL  CBC with Differential/Platelet   Collection Time: 05/04/23  3:57 PM  Result Value Ref Range   WBC 15.9 (H) 4.0 - 10.5 K/uL   RBC  3.05 (L) 3.87 - 5.11 MIL/uL   Hemoglobin 9.4 (L) 12.0 - 15.0 g/dL   HCT 16.1 (L) 09.6 - 04.5 %   MCV 89.5 80.0 - 100.0 fL   MCH 30.8 26.0 - 34.0 pg   MCHC 34.4 30.0 - 36.0 g/dL   RDW 40.9 81.1 - 91.4 %   Platelets 368 150 - 400 K/uL   nRBC 0.0 0.0 - 0.2 %   Neutrophils Relative % 71 %   Neutro Abs 11.3 (H) 1.7 - 7.7 K/uL   Lymphocytes Relative 20 %   Lymphs Abs 3.1 0.7 - 4.0 K/uL   Monocytes Relative 7 %   Monocytes Absolute 1.2 (H) 0.1 - 1.0 K/uL   Eosinophils Relative 1 %   Eosinophils Absolute 0.2 0.0 - 0.5 K/uL   Basophils Relative 0 %   Basophils Absolute 0.0 0.0 - 0.1 K/uL   Immature Granulocytes 1 %   Abs Immature Granulocytes 0.08 (H) 0.00 - 0.07 K/uL    Sickle Cells PRESENT    Target Cells PRESENT   Reticulocytes   Collection Time: 05/04/23  3:57 PM  Result Value Ref Range   Retic Ct Pct 4.6 (H) 0.4 - 3.1 %   RBC. 2.99 (L) 3.87 - 5.11 MIL/uL   Retic Count, Absolute 136.0 19.0 - 186.0 K/uL   Immature Retic Fract 26.6 (H) 2.3 - 15.9 %   DG Chest 2 View Result Date: 05/04/2023 CLINICAL DATA:  Sickle cell disease, body aches, rhinorrhea EXAM: CHEST - 2 VIEW COMPARISON:  03/22/2023 FINDINGS: Frontal and lateral views of the chest demonstrate an unremarkable cardiac silhouette. No acute airspace disease, effusion, or pneumothorax. There are no acute bony abnormalities. IMPRESSION: 1. No acute intrathoracic process. Electronically Signed   By: Sharlet Salina M.D.   On: 05/04/2023 17:50       Fayrene Helper, PA-C 05/04/23 1805    Charlynne Pander, MD 05/04/23 [phone]

## 2023-05-04 NOTE — ED Provider Notes (Signed)
 [place] EMERGENCY DEPARTMENT AT [facility] Provider Note   CSN: [id number] Arrival date & time: 05/04/23  1033     History  Chief Complaint  Patient presents with   URI    CHENAY NESMITH is a 18 y.o. female with a history of sickle cell disease presents to the ED today with body aches and rhinorrhea.  Patient reports that she woke up this morning around 5 AM with bodyaches that "feels different from sickle cell pain" as well as runny nose and sore throat.  States that body aches feels similar as to when she had COVID.  Denies fevers, cough, vomiting, or diarrhea.  States that she is getting it with the larger people yesterday but denies any known sick contact.  She has not take anything for symptoms prior to arrival.  No chest pain or shortness of breath.  No additional complaints or concerns at this time.    Home Medications Prior to Admission medications   Medication Sig Start Date End Date Taking? Authorizing Provider  acetaminophen (TYLENOL) 500 MG tablet Take 500 mg by mouth 2 (two) times daily as needed for moderate pain, fever or headache. Patient not taking: Reported on 02/20/2023    [provider]  acetaminophen-codeine (TYLENOL #3) 300-30 MG tablet Take 1 tablet by mouth every 4 (four) hours as needed for moderate pain. Patient not taking: Reported on 02/20/2023 11/21/22   [provider]  amoxicillin-clavulanate (AUGMENTIN) 250-125 MG tablet Take 1 tablet by mouth 3 (three) times daily. Patient not taking: Reported on 02/20/2023 11/20/22   [provider]  cephALEXin (KEFLEX) 500 MG capsule Take 1 capsule (500 mg total) by mouth 4 (four) times daily. 03/22/23   Barrett, Horald Chestnut, PA-C  ENDARI 5 g PACK Powder Packet Take 5 g by mouth in the morning, at noon, and at bedtime. Patient not taking: Reported on 06/04/2022 04/24/19   Anastasia Pall, MD  hydroxyurea (HYDREA) 500 MG capsule Take 500 mg by mouth 2 (two) times daily. 06/13/17    [provider]  ibuprofen (IBU) 600 MG tablet Take 1 tablet (600 mg total) by mouth every 8 (eight) hours as needed for moderate pain (pain score 4-6). 04/16/23   Linwood Dibbles, MD  naloxone [facility]) nasal spray 4 mg/0.1 mL Place 1 spray into the nose once. Patient not taking: Reported on 02/20/2023 11/15/22   [provider]  oxycodone (OXY-IR) 5 MG capsule Take 2 capsules (10 mg total) by mouth every 6 (six) hours as needed. 04/16/23   Linwood Dibbles, MD  oxyCODONE-acetaminophen (PERCOCET/ROXICET) 5-325 MG tablet Take 1 tablet by mouth every 6 (six) hours as needed for severe pain (pain score 7-10). 03/22/23   Barrett, Horald Chestnut, PA-C  penicillin v potassium (VEETID) 250 MG tablet Take 250 mg by mouth 2 times daily at 12 noon and 4 pm. 04/10/16   [provider]  polyethylene glycol (MIRALAX) 17 g packet Take 17 g by mouth 2 (two) times daily. Patient not taking: Reported on 02/20/2023 06/09/22   Krystal Clark, MD      Allergies    Patient has no known allergies.    Review of Systems   Review of Systems  Constitutional:  Positive for fatigue.  HENT:  Positive for rhinorrhea.   All other systems reviewed and are negative.   Physical Exam Updated Vital Signs BP 112/75   Pulse 93   Temp 98.6 F (37 C)   Ht 5\' 2"  (1.575 m)   Wt  58.6 kg   SpO2 100%   BMI 23.63 kg/m  Physical Exam Vitals and nursing note reviewed.  Constitutional:      General: She is not in acute distress.    Appearance: Normal appearance.  HENT:     Head: Normocephalic and atraumatic.     Mouth/Throat:     Mouth: Mucous membranes are moist.     Pharynx: No oropharyngeal exudate or posterior oropharyngeal erythema.     Comments: Slightly enlarged tonsils without exudates Eyes:     Conjunctiva/sclera: Conjunctivae normal.     Pupils: Pupils are equal, round, and reactive to light.  Cardiovascular:     Rate and Rhythm: Normal rate and regular rhythm.     Pulses: Normal pulses.     Heart sounds:  Normal heart sounds.  Pulmonary:     Effort: Pulmonary effort is normal.     Breath sounds: Normal breath sounds.  Abdominal:     Palpations: Abdomen is soft.     Tenderness: There is no abdominal tenderness. There is no right CVA tenderness or left CVA tenderness.  Musculoskeletal:        General: Normal range of motion.     Cervical back: Normal range of motion.  Skin:    General: Skin is warm and dry.     Findings: No rash.  Neurological:     General: No focal deficit present.     Mental Status: She is alert.  Psychiatric:        Mood and Affect: Mood normal.        Behavior: Behavior normal.    ED Results / Procedures / Treatments   Labs (all labs ordered are listed, but only abnormal results are displayed) Labs Reviewed  COMPREHENSIVE METABOLIC PANEL - Abnormal; Notable for the following components:      Result Value   BUN <5 (*)    Creatinine, Ser 0.42 (*)    Alkaline Phosphatase 31 (*)    Total Bilirubin 2.0 (*)    All other components within normal limits  RESP PANEL BY RT-PCR (RSV, FLU A&B, COVID)  RVPGX2  GROUP A STREP BY PCR  RETICULOCYTES  CBC WITH DIFFERENTIAL/PLATELET  HCG, QUANTITATIVE, PREGNANCY  POC URINE PREG, ED    EKG None  Radiology No results found.  Procedures Procedures: not indicated.   Medications Ordered in ED Medications  acetaminophen (TYLENOL) tablet 650 mg (650 mg Oral Given 05/04/23 1237)  oxyCODONE-acetaminophen (PERCOCET/ROXICET) 5-325 MG per tablet 1 tablet (1 tablet Oral Given 05/04/23 1429)    ED Course/ Medical Decision Making/ A&P                                 Medical Decision Making Amount and/or Complexity of Data Reviewed Labs: ordered.  Risk OTC drugs. Prescription drug management.   This patient presents to the ED for concern of body aches, this involves an extensive number of treatment options, and is a complaint that carries with it a high risk of complications and morbidity.   Differential diagnosis  includes: COVID, flu, RSV, strep throat, other viral illness, sickle cell pain crisis, etc.   Comorbidities  See HPI above   Additional History  Additional history obtained from prior records   Lab Tests  I ordered and personally interpreted labs.  The pertinent results include:   Negative strep test Negative respiratory panel   Problem List / ED Course / Critical Interventions / Medication  Management  Bodyaches that began this morning with associated runny nose and sore throat. Symptoms feel similar to when she had COVID. Pain does not feel like sickle cell pain. Discussed findings with patient. She was on the phone with her mother and mother wants labs for sickle cell anemia completed. I ordered medications including: Tylenol for pain  Reevaluation of the patient after these medicines showed that the patient stayed the same. I then ordered percocet which helped the pain improve.    Social Determinants of Health  Social connection   Test / Admission - Considered  Patient care taken over by Fayrene Helper, PA-C, at shift change. Disposition pending labs and pain control.        Final Clinical Impression(s) / ED Diagnoses Final diagnoses:  None    Rx / DC Orders ED Discharge Orders     None         Maxwell Marion, PA-C 05/04/23 1533    Benjiman Core, MD 05/07/23 [phone]

## 2023-05-05 ENCOUNTER — Emergency Department (HOSPITAL_COMMUNITY)
Admission: EM | Admit: 2023-05-05 | Discharge: 2023-05-05 | Disposition: A | Discharge status: Home / Self Care | Attending: Emergency Medicine | Admitting: Emergency Medicine

## 2023-05-05 ENCOUNTER — Emergency Department (HOSPITAL_COMMUNITY)

## 2023-05-05 DIAGNOSIS — D5700 Hb-SS disease with crisis, unspecified: Secondary | ICD-10-CM | POA: Diagnosis present

## 2023-05-05 LAB — CBC WITH DIFFERENTIAL/PLATELET
Abs Immature Granulocytes: 0.06 10*3/uL (ref 0.00–0.07)
Basophils Absolute: 0 10*3/uL (ref 0.0–0.1)
Basophils Relative: 0 %
Eosinophils Absolute: 0.3 10*3/uL (ref 0.0–0.5)
Eosinophils Relative: 2 %
HCT: 28.9 % — ABNORMAL LOW (ref 36.0–46.0)
Hemoglobin: 10 g/dL — ABNORMAL LOW (ref 12.0–15.0)
Immature Granulocytes: 0 %
Lymphocytes Relative: 12 %
Lymphs Abs: 2.2 10*3/uL (ref 0.7–4.0)
MCH: 31.1 pg (ref 26.0–34.0)
MCHC: 34.6 g/dL (ref 30.0–36.0)
MCV: 89.8 fL (ref 80.0–100.0)
Monocytes Absolute: 1.1 10*3/uL — ABNORMAL HIGH (ref 0.1–1.0)
Monocytes Relative: 6 %
Neutro Abs: 14.1 10*3/uL — ABNORMAL HIGH (ref 1.7–7.7)
Neutrophils Relative %: 80 %
Platelets: 431 10*3/uL — ABNORMAL HIGH (ref 150–400)
RBC: 3.22 MIL/uL — ABNORMAL LOW (ref 3.87–5.11)
RDW: 14.2 % (ref 11.5–15.5)
WBC: 17.8 10*3/uL — ABNORMAL HIGH (ref 4.0–10.5)
nRBC: 0 % (ref 0.0–0.2)

## 2023-05-05 LAB — RETICULOCYTES
Immature Retic Fract: 26.2 % — ABNORMAL HIGH (ref 2.3–15.9)
RBC.: 3.25 MIL/uL — ABNORMAL LOW (ref 3.87–5.11)
Retic Count, Absolute: 142 10*3/uL (ref 19.0–186.0)
Retic Ct Pct: 4.4 % — ABNORMAL HIGH (ref 0.4–3.1)

## 2023-05-05 MED ORDER — SODIUM CHLORIDE 0.9 % IV BOLUS
1000.0000 mL | Freq: Once | INTRAVENOUS | Status: AC
  Administered 2023-05-05: 1000 mL via INTRAVENOUS

## 2023-05-05 MED ORDER — OXYCODONE-ACETAMINOPHEN 5-325 MG PO TABS: 2.0000 | ORAL_TABLET | ORAL | 0 refills | Status: DC | PRN

## 2023-05-05 MED ORDER — ONDANSETRON 4 MG PO TBDP
4.0000 mg | ORAL_TABLET | Freq: Once | ORAL | Status: AC
  Administered 2023-05-05: 4 mg via ORAL
  Filled 2023-05-05: qty 1

## 2023-05-05 MED ORDER — OXYCODONE-ACETAMINOPHEN 5-325 MG PO TABS: 1.0000 | ORAL_TABLET | Freq: Four times a day (QID) | ORAL | 0 refills | Status: DC | PRN

## 2023-05-05 MED ORDER — HYDROMORPHONE HCL 1 MG/ML IJ SOLN
1.0000 mg | Freq: Once | INTRAMUSCULAR | Status: AC
  Administered 2023-05-05: 1 mg via INTRAMUSCULAR
  Filled 2023-05-05: qty 1

## 2023-05-05 MED ORDER — ONDANSETRON 4 MG PO TBDP: ORAL_TABLET | ORAL | 0 refills | Status: AC

## 2023-05-05 MED ORDER — ONDANSETRON HCL 4 MG/2ML IJ SOLN
4.0000 mg | Freq: Once | INTRAMUSCULAR | Status: AC
  Administered 2023-05-05: 4 mg via INTRAVENOUS
  Filled 2023-05-05: qty 2

## 2023-05-05 MED ORDER — HYDROMORPHONE HCL 1 MG/ML IJ SOLN
0.5000 mg | Freq: Once | INTRAMUSCULAR | Status: AC
  Administered 2023-05-05: 0.5 mg via INTRAVENOUS
  Filled 2023-05-05: qty 1

## 2023-05-05 MED ORDER — KETOROLAC TROMETHAMINE 30 MG/ML IJ SOLN
30.0000 mg | Freq: Once | INTRAMUSCULAR | Status: AC
  Administered 2023-05-05: 30 mg via INTRAVENOUS
  Filled 2023-05-05: qty 1

## 2023-05-05 NOTE — ED Provider Notes (Signed)
 [place] EMERGENCY DEPARTMENT AT [facility] Provider Note   CSN: [id number] Arrival date & time: 05/05/23  4098     History {Add pertinent medical, surgical, social history, OB history to HPI:1} Chief Complaint  Patient presents with   Chest Pain   Sickle Cell Pain Crisis    Janet Stone is a 18 y.o. female.  Patient with sickle cell disease.  She complains of pain all over.  She was seen yesterday and treated but then told to take Tylenol at home   Chest Pain Sickle Cell Pain Crisis Associated symptoms: chest pain        Home Medications Prior to Admission medications   Medication Sig Start Date End Date Taking? Authorizing Provider  acetaminophen (TYLENOL) 500 MG tablet Take 2 tablets (1,000 mg total) by mouth every 8 (eight) hours as needed for fever, mild pain (pain score 1-3) or moderate pain (pain score 4-6). 05/04/23  Yes Fayrene Helper, PA-C  benzonatate (TESSALON) 100 MG capsule Take 1 capsule (100 mg total) by mouth every 8 (eight) hours. 05/04/23  Yes Fayrene Helper, PA-C  EC-NAPROXEN 500 MG EC tablet Take 500 mg by mouth 2 (two) times daily as needed (Pain). 05/03/23  Yes [provider]  Glutamine, Sickle Cell, (ENDARI PO) Take 1 Dose by mouth daily.   Yes [provider]  hydroxyurea (HYDREA) 500 MG capsule Take 500 mg by mouth 3 (three) times daily. 06/13/17  Yes [provider]  ibuprofen (IBU) 600 MG tablet Take 1 tablet (600 mg total) by mouth every 8 (eight) hours as needed for moderate pain (pain score 4-6). 04/16/23  Yes Linwood Dibbles, MD  ondansetron (ZOFRAN-ODT) 4 MG disintegrating tablet 4mg  ODT q4 hours prn nausea/vomit 05/05/23  Yes Bethann Berkshire, MD  penicillin v potassium (VEETID) 250 MG tablet Take 250 mg by mouth 2 times daily at 12 noon and 4 pm. 04/10/16  Yes [provider]  oxycodone (OXY-IR) 5 MG capsule Take 2 capsules (10 mg total) by mouth every 6 (six) hours as needed. Patient not taking: Reported on  05/05/2023 04/16/23   Linwood Dibbles, MD  oxyCODONE-acetaminophen (PERCOCET) 5-325 MG tablet Take 1 tablet by mouth every 6 (six) hours as needed. 05/05/23   Bethann Berkshire, MD      Allergies    Patient has no known allergies.    Review of Systems   Review of Systems  Cardiovascular:  Positive for chest pain.    Physical Exam Updated Vital Signs BP 101/64   Pulse (!) 55   Temp 98.6 F (37 C) (Oral)   Resp 18   Ht 5\' 2"  (1.575 m)   Wt 58 kg   LMP 05/04/2023   SpO2 97%   BMI 23.39 kg/m  Physical Exam  ED Results / Procedures / Treatments   Labs (all labs ordered are listed, but only abnormal results are displayed) Labs Reviewed  CBC WITH DIFFERENTIAL/PLATELET - Abnormal; Notable for the following components:      Result Value   WBC 17.8 (*)    RBC 3.22 (*)    Hemoglobin 10.0 (*)    HCT 28.9 (*)    Platelets 431 (*)    Neutro Abs 14.1 (*)    Monocytes Absolute 1.1 (*)    All other components within normal limits  RETICULOCYTES - Abnormal; Notable for the following components:   Retic Ct Pct 4.4 (*)    RBC. 3.25 (*)    Immature Retic Fract 26.2 (*)  All other components within normal limits    EKG EKG Interpretation Date/Time:  Monday May 05 2023 09:07:42 EST Ventricular Rate:  99 PR Interval:  166 QRS Duration:  68 QT Interval:  337 QTC Calculation: 433 R Axis:   47  Text Interpretation: Sinus rhythm Confirmed by Bethann Berkshire [phone]) on 05/05/2023 9:47:56 AM  Radiology DG Chest 2 View Result Date: 05/05/2023 CLINICAL DATA:  Chest pain EXAM: CHEST - 2 VIEW COMPARISON:  05/04/2023 and older FINDINGS: No consolidation, pneumothorax or effusion. No edema. Normal cardiopericardial silhouette. Surgical clips in the right upper quadrant. IMPRESSION: No acute cardiopulmonary disease. Electronically Signed   By: Karen Kays M.D.   On: 05/05/2023 10:19   DG Chest 2 View Result Date: 05/04/2023 CLINICAL DATA:  Sickle cell disease, body aches, rhinorrhea EXAM: CHEST - 2  VIEW COMPARISON:  03/22/2023 FINDINGS: Frontal and lateral views of the chest demonstrate an unremarkable cardiac silhouette. No acute airspace disease, effusion, or pneumothorax. There are no acute bony abnormalities. IMPRESSION: 1. No acute intrathoracic process. Electronically Signed   By: Sharlet Salina M.D.   On: 05/04/2023 17:50    Procedures Procedures  {Document cardiac monitor, telemetry assessment procedure when appropriate:1}  Medications Ordered in ED Medications  sodium chloride 0.9 % bolus 1,000 mL (1,000 mLs Intravenous New Bag/Given 05/05/23 1259)  HYDROmorphone (DILAUDID) injection 1 mg (1 mg Intramuscular Given 05/05/23 0952)  ondansetron (ZOFRAN-ODT) disintegrating tablet 4 mg (4 mg Oral Given 05/05/23 0952)  ketorolac (TORADOL) 30 MG/ML injection 30 mg (30 mg Intravenous Given 05/05/23 1303)  ondansetron (ZOFRAN) injection 4 mg (4 mg Intravenous Given 05/05/23 1303)  HYDROmorphone (DILAUDID) injection 0.5 mg (0.5 mg Intravenous Given 05/05/23 1302)    ED Course/ Medical Decision Making/ A&P   {   Click here for ABCD2, HEART and other calculatorsREFRESH Note before signing :1}                              Medical Decision Making Amount and/or Complexity of Data Reviewed Labs: ordered. Radiology: ordered.  Risk Prescription drug management.   Patient with sickle cell crisis.  She will be discharged home with Percocets and Zofran and follow-up with her doctor  {Document critical care time when appropriate:1} {Document review of labs and clinical decision tools ie heart score, Chads2Vasc2 etc:1}  {Document your independent review of radiology images, and any outside records:1} {Document your discussion with family members, caretakers, and with consultants:1} {Document social determinants of health affecting pt's care:1} {Document your decision making why or why not admission, treatments were needed:1} Final Clinical Impression(s) / ED Diagnoses Final diagnoses:  Sickle  cell pain crisis (HCC)    Rx / DC Orders ED Discharge Orders          Ordered    oxyCODONE-acetaminophen (PERCOCET) 5-325 MG tablet  Every 4 hours PRN,   Status:  Discontinued        05/05/23 1541    oxyCODONE-acetaminophen (PERCOCET) 5-325 MG tablet  Every 4 hours PRN,   Status:  Discontinued        05/05/23 1542    oxyCODONE-acetaminophen (PERCOCET) 5-325 MG tablet  Every 6 hours PRN        05/05/23 1542    ondansetron (ZOFRAN-ODT) 4 MG disintegrating tablet        05/05/23 1543

## 2023-05-05 NOTE — ED Triage Notes (Signed)
 Pt arrived via POV. C/o chest pain that began yesterday. Seen her yesterday for a viral illness. Has not taken any pain meds at home.  AOx4

## 2023-05-05 NOTE — Care Management (Signed)
 Transition of Care [facility]) - Emergency Department Mini Assessment   Patient Details  Name: KAYLEEANN HUXFORD MRN: [id number] Date of Birth: December 29, 2005  [facility]) CM/SW Contact:    Lavenia Atlas, RN Phone Number: 05/05/2023, 2:17 PM   Clinical Narrative: This RNCM received a secure chat from [facility] RNCM indicating patient's insurance called requesting prior authorization for pain meds. Per chart review patient was recently seen in [facility] ED for cold symptoms. No TOC consult on file. Patient has returned to [facility] ED today. PMH: sickle cell disease.  This RNCM spoke with Georgiann Hahn. [phone] prior auth who reports currently there's no request on file however transferred this RNCM to Prior auth pharmacy dept.  This RNCM spoke with Victorino Dike with [facility] pharmacy who reports the acetaminophen (1000mg  po, every 8 hours , #30) prescription written on 05/04/23 prior to this admission is not covered by insurance, patient would need to purchase over the counter. Insurance will cover ibuprofen 600mg .   Notified EDP.  No additional TOC needs at this time.      ED Mini Assessment: What brought you to the Emergency Department? : C/o chest pain that began yesterday  Barriers to Discharge: Continued Medical Work up  [employer] interventions: Coordinating medication prior [employer] of departure: Car  Interventions which prevented an admission or readmission: Medication Review    Patient Contact and Communications        ,          Patient states their goals for this hospitalization and ongoing recovery are:: to feel better      Admission diagnosis:  chst pain;shob Patient Active Problem List   Diagnosis Date Noted   Hyperkalemia 11/26/2022   Sickle cell anemia (HCC) 04/12/2020   Constipation due to opioid therapy 04/12/2020   COVID-19 04/23/2019   COVID-19 virus infection 04/23/2019   Pain of left lower extremity    S/P cholecystectomy 12/02/2015   S/P appendectomy 12/02/2015    Oxygen desaturation    Acute sickle cell splenic sequestration crisis (HCC)    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   Sickle cell crisis (HCC)    SOB (shortness of breath)    School problem 03/27/2015   Fever, unspecified    Wheezing    Sickle cell disease, type SS (HCC)    Hb-SS disease with crisis (HCC)    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

## 2023-05-05 NOTE — Discharge Instructions (Signed)
 Follow-up with your sickle cell doctor within the next week.  Take that pain medicine if Tylenol or Motrin does not help the pain also use the nausea medicine

## 2023-05-05 NOTE — Care Management (Signed)
 Patient insurance company called. They need a form filled out and faxed back for pre-authorization for pain medication. They can be reached at [phone] hit the option for authorization. Message passed along to [facility] and MD Dr Estell Harpin

## 2023-07-14 ENCOUNTER — Inpatient Hospital Stay (HOSPITAL_COMMUNITY)
Admission: EM | Admit: 2023-07-14 | Discharge: 2023-07-19 | DRG: 812 | Disposition: A | Discharge status: Home / Self Care | Attending: Internal Medicine | Admitting: Internal Medicine

## 2023-07-14 ENCOUNTER — Inpatient Hospital Stay (HOSPITAL_COMMUNITY)

## 2023-07-14 ENCOUNTER — Other Ambulatory Visit: Payer: Self-pay

## 2023-07-14 DIAGNOSIS — J069 Acute upper respiratory infection, unspecified: Secondary | ICD-10-CM | POA: Insufficient documentation

## 2023-07-14 DIAGNOSIS — K5900 Constipation, unspecified: Secondary | ICD-10-CM | POA: Diagnosis present

## 2023-07-14 DIAGNOSIS — Z9081 Acquired absence of spleen: Secondary | ICD-10-CM

## 2023-07-14 DIAGNOSIS — M545 Low back pain, unspecified: Secondary | ICD-10-CM

## 2023-07-14 DIAGNOSIS — D638 Anemia in other chronic diseases classified elsewhere: Secondary | ICD-10-CM | POA: Diagnosis present

## 2023-07-14 DIAGNOSIS — E876 Hypokalemia: Secondary | ICD-10-CM | POA: Diagnosis present

## 2023-07-14 DIAGNOSIS — Z825 Family history of asthma and other chronic lower respiratory diseases: Secondary | ICD-10-CM | POA: Diagnosis not present

## 2023-07-14 DIAGNOSIS — Z832 Family history of diseases of the blood and blood-forming organs and certain disorders involving the immune mechanism: Secondary | ICD-10-CM

## 2023-07-14 DIAGNOSIS — D730 Hyposplenism: Secondary | ICD-10-CM

## 2023-07-14 DIAGNOSIS — M549 Dorsalgia, unspecified: Secondary | ICD-10-CM | POA: Insufficient documentation

## 2023-07-14 DIAGNOSIS — G894 Chronic pain syndrome: Secondary | ICD-10-CM | POA: Diagnosis present

## 2023-07-14 DIAGNOSIS — D5700 Hb-SS disease with crisis, unspecified: Secondary | ICD-10-CM | POA: Diagnosis present

## 2023-07-14 LAB — URINALYSIS, ROUTINE W REFLEX MICROSCOPIC
Bilirubin Urine: NEGATIVE
Glucose, UA: NEGATIVE mg/dL
Ketones, ur: NEGATIVE mg/dL
Nitrite: NEGATIVE
Protein, ur: NEGATIVE mg/dL
Specific Gravity, Urine: 1.01 (ref 1.005–1.030)
pH: 5 (ref 5.0–8.0)

## 2023-07-14 LAB — CBC WITH DIFFERENTIAL/PLATELET
Abs Immature Granulocytes: 0.03 10*3/uL (ref 0.00–0.07)
Basophils Absolute: 0 10*3/uL (ref 0.0–0.1)
Basophils Relative: 0 %
Eosinophils Absolute: 0.2 10*3/uL (ref 0.0–0.5)
Eosinophils Relative: 2 %
HCT: 29.8 % — ABNORMAL LOW (ref 36.0–46.0)
Hemoglobin: 10.1 g/dL — ABNORMAL LOW (ref 12.0–15.0)
Immature Granulocytes: 0 %
Lymphocytes Relative: 35 %
Lymphs Abs: 3.3 10*3/uL (ref 0.7–4.0)
MCH: 30.8 pg (ref 26.0–34.0)
MCHC: 33.9 g/dL (ref 30.0–36.0)
MCV: 90.9 fL (ref 80.0–100.0)
Monocytes Absolute: 0.8 10*3/uL (ref 0.1–1.0)
Monocytes Relative: 8 %
Neutro Abs: 5.2 10*3/uL (ref 1.7–7.7)
Neutrophils Relative %: 55 %
Platelets: 371 10*3/uL (ref 150–400)
RBC: 3.28 MIL/uL — ABNORMAL LOW (ref 3.87–5.11)
RDW: 14.5 % (ref 11.5–15.5)
WBC: 9.5 10*3/uL (ref 4.0–10.5)
nRBC: 0 % (ref 0.0–0.2)

## 2023-07-14 LAB — COMPREHENSIVE METABOLIC PANEL WITH GFR
ALT: 12 U/L (ref 0–44)
AST: 23 U/L (ref 15–41)
Albumin: 4.5 g/dL (ref 3.5–5.0)
Alkaline Phosphatase: 31 U/L — ABNORMAL LOW (ref 38–126)
Anion gap: 9 (ref 5–15)
BUN: 8 mg/dL (ref 6–20)
CO2: 21 mmol/L — ABNORMAL LOW (ref 22–32)
Calcium: 9.3 mg/dL (ref 8.9–10.3)
Chloride: 109 mmol/L (ref 98–111)
Creatinine, Ser: 0.44 mg/dL (ref 0.44–1.00)
GFR, Estimated: >60 mL/min (ref >60)
Glucose, Bld: 86 mg/dL (ref 70–99)
Potassium: 4.1 mmol/L (ref 3.5–5.1)
Sodium: 139 mmol/L (ref 135–145)
Total Bilirubin: 1 mg/dL (ref 0.0–1.2)
Total Protein: 7.7 g/dL (ref 6.5–8.1)

## 2023-07-14 LAB — RETICULOCYTES
Immature Retic Fract: 26.8 % — ABNORMAL HIGH (ref 2.3–15.9)
RBC.: 3.28 MIL/uL — ABNORMAL LOW (ref 3.87–5.11)
Retic Count, Absolute: 148.9 10*3/uL (ref 19.0–186.0)
Retic Ct Pct: 4.5 % — ABNORMAL HIGH (ref 0.4–3.1)

## 2023-07-14 LAB — MAGNESIUM: Magnesium: 2 mg/dL (ref 1.7–2.4)

## 2023-07-14 LAB — PREGNANCY, URINE: Preg Test, Ur: NEGATIVE

## 2023-07-14 LAB — PHOSPHORUS: Phosphorus: 3.6 mg/dL (ref 2.5–4.6)

## 2023-07-14 LAB — LACTATE DEHYDROGENASE: LDH: 249 U/L — ABNORMAL HIGH (ref 98–192)

## 2023-07-14 LAB — CK: Total CK: 50 U/L (ref 38–234)

## 2023-07-14 MED ORDER — HYDROMORPHONE HCL 1 MG/ML IJ SOLN
0.5000 mg | INTRAMUSCULAR | Status: AC
  Administered 2023-07-14: 0.5 mg via INTRAVENOUS
  Filled 2023-07-14: qty 1

## 2023-07-14 MED ORDER — HYDROMORPHONE HCL 1 MG/ML IJ SOLN
1.0000 mg | INTRAMUSCULAR | Status: AC
  Administered 2023-07-14: 1 mg via INTRAVENOUS
  Filled 2023-07-14: qty 1

## 2023-07-14 MED ORDER — SODIUM CHLORIDE 0.9 % IV SOLN
12.5000 mg | Freq: Once | INTRAVENOUS | Status: AC
  Administered 2023-07-14: 12.5 mg via INTRAVENOUS
  Filled 2023-07-14: qty 12.5

## 2023-07-14 MED ORDER — PENICILLIN V POTASSIUM 250 MG PO TABS
250.0000 mg | ORAL_TABLET | Freq: Two times a day (BID) | ORAL | Status: DC
  Administered 2023-07-14 – 2023-07-19 (×10): 250 mg via ORAL
  Filled 2023-07-14 (×10): qty 1

## 2023-07-14 MED ORDER — SODIUM CHLORIDE 0.9% FLUSH: 9.0000 mL | INTRAVENOUS | Status: DC | PRN

## 2023-07-14 MED ORDER — KETOROLAC TROMETHAMINE 15 MG/ML IJ SOLN
15.0000 mg | INTRAMUSCULAR | Status: AC
  Administered 2023-07-14: 15 mg via INTRAVENOUS
  Filled 2023-07-14: qty 1

## 2023-07-14 MED ORDER — BISACODYL 5 MG PO TBEC: 5.0000 mg | DELAYED_RELEASE_TABLET | Freq: Every day | ORAL | Status: DC | PRN

## 2023-07-14 MED ORDER — HYDROMORPHONE 1 MG/ML IV SOLN
INTRAVENOUS | Status: DC
  Administered 2023-07-14: 30 mg via INTRAVENOUS
  Administered 2023-07-15: 1 mg via INTRAVENOUS
  Administered 2023-07-15: 2.5 mg via INTRAVENOUS
  Administered 2023-07-15: 1 mg via INTRAVENOUS
  Administered 2023-07-15: 2 mg via INTRAVENOUS
  Administered 2023-07-16: 1 mg via INTRAVENOUS
  Administered 2023-07-16: 2 mg via INTRAVENOUS
  Filled 2023-07-14: qty 30

## 2023-07-14 MED ORDER — NALOXONE HCL 0.4 MG/ML IJ SOLN: 0.4000 mg | INTRAMUSCULAR | Status: DC | PRN

## 2023-07-14 MED ORDER — KETOROLAC TROMETHAMINE 15 MG/ML IJ SOLN
15.0000 mg | Freq: Four times a day (QID) | INTRAMUSCULAR | Status: DC
  Administered 2023-07-15 – 2023-07-19 (×19): 15 mg via INTRAVENOUS
  Filled 2023-07-14 (×19): qty 1

## 2023-07-14 MED ORDER — DEXTROSE-SODIUM CHLORIDE 5-0.45 % IV SOLN: INTRAVENOUS | Status: DC

## 2023-07-14 MED ORDER — HYDROXYUREA 500 MG PO CAPS
500.0000 mg | ORAL_CAPSULE | Freq: Three times a day (TID) | ORAL | Status: DC
Start: 2023-07-14 — End: 2023-07-19
  Administered 2023-07-14 – 2023-07-19 (×14): 500 mg via ORAL
  Filled 2023-07-14 (×15): qty 1

## 2023-07-14 MED ORDER — SENNOSIDES-DOCUSATE SODIUM 8.6-50 MG PO TABS
1.0000 | ORAL_TABLET | Freq: Two times a day (BID) | ORAL | Status: DC
  Administered 2023-07-18: 1 via ORAL
  Filled 2023-07-14 (×8): qty 1

## 2023-07-14 MED ORDER — HYDROXYZINE HCL 25 MG PO TABS: 25.0000 mg | ORAL_TABLET | ORAL | Status: DC | PRN

## 2023-07-14 MED ORDER — DIPHENHYDRAMINE HCL 25 MG PO CAPS: 25.0000 mg | ORAL_CAPSULE | ORAL | Status: DC | PRN

## 2023-07-14 MED ORDER — ONDANSETRON HCL 4 MG/2ML IJ SOLN
4.0000 mg | INTRAMUSCULAR | Status: DC | PRN
  Administered 2023-07-15 – 2023-07-17 (×4): 4 mg via INTRAVENOUS
  Filled 2023-07-14 (×4): qty 2

## 2023-07-14 MED ORDER — POLYETHYLENE GLYCOL 3350 17 G PO PACK: 17.0000 g | PACK | Freq: Every day | ORAL | Status: DC | PRN

## 2023-07-14 NOTE — H&P (Signed)
 [name] JKD:[id number] DOB: 03-Dec-2005 DOA: 07/14/2023     PCP: Hamilton Levin, MD   Outpatient Specialists:   Hemaotology    Dr. Caretha Chapel    Patient arrived to ER on 07/14/23 at 1411 Referred by Attending Kommor, Alyse July, MD   Patient coming from:    home Lives    With family    Chief Complaint:   Chief Complaint  Patient presents with   Sickle Cell Pain Crisis    Pt endorses sickle cell pain crisis that started today. Pain located in her back, endorses some shortness of breath, denies chest pain. 8/10 pain at this time     HPI: Janet Stone is Stone 18 y.o. female with medical history significant of Sickle cell, acute chest , asplenia    Presented with  lower back pain Presents with low back pain consistent with her prior sickle cell crisis presentation Has ran out of her narcotics at home Usually goes to [facility]. No fevers or chills no chest pain or shortness of breath No neurological complaints Supposed to be on hydroxyurea  supposed to be on Percocet every 6 hours as needed She takes hydroxyurea  Reports have had kidney stones in the past but that does not feel the same  Took her last dose of oxycodone  today  She take 1.5 of oxycodone  10 as needed not every dayll  Denies significant ETOH intake   Does not smoke  light marijuana use      Regarding pertinent Chronic problems:    Sickle cell on hydroxyurea    While in ER: Clinical Course as of 07/14/23 2052  Mon Jul 14, 2023  2044 Dr. Hendrick Locke with medicine for admission [BH]    Clinical Course User Index [BH] Henderly, Britni A, PA-C     Hemoglobin stable at 10.1 Total bili 1.0 Retic % 4.5    Lab Orders         Comprehensive metabolic panel         CBC WITH DIFFERENTIAL         Urinalysis, Routine w reflex microscopic -Urine, Clean Catch         Reticulocytes         Pregnancy, urine       CTabd/pelvis - ordered    Following Medications were ordered in ER: Medications  ondansetron   (ZOFRAN ) injection 4 mg (has no administration in time range)  HYDROmorphone  (DILAUDID ) injection 0.5 mg (0.5 mg Intravenous Given 07/14/23 1838)  HYDROmorphone  (DILAUDID ) injection 1 mg (1 mg Intravenous Given 07/14/23 1911)  HYDROmorphone  (DILAUDID ) injection 1 mg (1 mg Intravenous Given 07/14/23 2013)  diphenhydrAMINE  (BENADRYL ) 12.5 mg in sodium chloride  0.9 % 50 mL IVPB (12.5 mg Intravenous New Bag/Given 07/14/23 1912)  ketorolac  (TORADOL ) 15 MG/ML injection 15 mg (15 mg Intravenous Given 07/14/23 1815)        ED Triage Vitals  Encounter Vitals Group     BP 07/14/23 1445 (!) 101/58     Systolic BP Percentile --      Diastolic BP Percentile --      Pulse Rate 07/14/23 1445 79     Resp 07/14/23 1445 16     Temp 07/14/23 1445 98.7 F (37.1 C)     Temp Source 07/14/23 1445 Oral     SpO2 07/14/23 1445 100 %     Weight --      Height --      Head Circumference --      Peak Flow --  Pain Score 07/14/23 1544 8     Pain Loc --      Pain Education --      Exclude from Growth Chart --   BJYN(82)@     _________________________________________ Significant initial  Findings: Abnormal Labs Reviewed  COMPREHENSIVE METABOLIC PANEL WITH GFR - Abnormal; Notable for the following components:      Result Value   CO2 21 (*)    Alkaline Phosphatase 31 (*)    All other components within normal limits  CBC WITH DIFFERENTIAL/PLATELET - Abnormal; Notable for the following components:   RBC 3.28 (*)    Hemoglobin 10.1 (*)    HCT 29.8 (*)    All other components within normal limits  URINALYSIS, ROUTINE W REFLEX MICROSCOPIC - Abnormal; Notable for the following components:   APPearance HAZY (*)    Hgb urine dipstick LARGE (*)    Leukocytes,Ua TRACE (*)    Bacteria, UA FEW (*)    All other components within normal limits  RETICULOCYTES - Abnormal; Notable for the following components:   Retic Ct Pct 4.5 (*)    RBC. 3.28 (*)    Immature Retic Fract 26.8 (*)    All other components within  normal limits        The recent clinical data is shown below. Vitals:   07/14/23 1445 07/14/23 1758  BP: (!) 101/58 105/62  Pulse: 79 72  Resp: 16 16  Temp: 98.7 F (37.1 C) 98.3 F (36.8 C)  TempSrc: Oral Oral  SpO2: 100% 100%     WBC     Component Value Date/Time   WBC 9.5 07/14/2023 1544   LYMPHSABS PENDING 07/14/2023 1544   MONOABS PENDING 07/14/2023 1544   EOSABS PENDING 07/14/2023 1544   BASOSABS PENDING 07/14/2023 1544      UA   no evidence of UTI      Urine analysis:    Component Value Date/Time   COLORURINE YELLOW 07/14/2023 1544   APPEARANCEUR HAZY (Stone) 07/14/2023 1544   LABSPEC 1.010 07/14/2023 1544   PHURINE 5.0 07/14/2023 1544   GLUCOSEU NEGATIVE 07/14/2023 1544   HGBUR LARGE (Stone) 07/14/2023 1544   BILIRUBINUR NEGATIVE 07/14/2023 1544   KETONESUR NEGATIVE 07/14/2023 1544   PROTEINUR NEGATIVE 07/14/2023 1544   UROBILINOGEN 0.2 08/11/2014 0800   NITRITE NEGATIVE 07/14/2023 1544   LEUKOCYTESUR TRACE (Stone) 07/14/2023 1544     _____    __________________________________________________________ Recent Labs  Lab 07/14/23 1544  NA 139  K 4.1  CO2 21*  GLUCOSE 86  BUN 8  CREATININE 0.44  CALCIUM 9.3    Cr   stable,    Lab Results  Component Value Date   CREATININE 0.44 07/14/2023   CREATININE 0.42 (L) 05/04/2023   CREATININE 0.50 04/16/2023    Recent Labs  Lab 07/14/23 1544  AST 23  ALT 12  ALKPHOS 31*  BILITOT 1.0  PROT 7.7  ALBUMIN 4.5   Lab Results  Component Value Date   CALCIUM 9.3 07/14/2023        Plt: Lab Results  Component Value Date   PLT 371 07/14/2023    Recent Labs  Lab 07/14/23 1544  WBC 9.5  NEUTROABS PENDING  HGB 10.1*  HCT 29.8*  MCV 90.9  PLT 371    HG/HCT  stable,     Component Value Date/Time   HGB 10.1 (L) 07/14/2023 1544   HCT 29.8 (L) 07/14/2023 1544   MCV 90.9 07/14/2023 1544    _______________________________________________ Hospitalist was called for  admission for   Sickle cell pain  crisis      The following Work up has been ordered so far:  Orders Placed This Encounter  Procedures   Comprehensive metabolic panel   CBC WITH DIFFERENTIAL   Urinalysis, Routine w reflex microscopic -Urine, Clean Catch   Reticulocytes   Pregnancy, urine   Vital signs with O2 sat, q1hour   ED Cardiac monitoring   Monitor O2 SATs   Weigh patient in Kg   Saline Lock IV-Maintain IV access   Patient may eat/drink   Initiate Carrier Fluid Protocol   Consult to hospitalist     OTHER Significant initial  Findings:  labs showing:     DM  labs:  HbA1C: No results for input(s): "HGBA1C" in the last 8760 hours.     CBG (last 3)  No results for input(s): "GLUCAP" in the last 72 hours.        Cultures:    Component Value Date/Time   SDES THROAT 04/11/2020 1803   SPECREQUEST Immunocompromised 04/11/2020 1803   CULT  04/11/2020 1803    NO NEISSERIA GONORRHOEAE ISOLATED Performed at [facility], [address] [place], [place] [address]    REPTSTATUS 04/14/2020 FINAL 04/11/2020 1803     Radiological Exams on Admission: No results found. _______________________________________________________________________________________________________ Latest  Blood pressure 105/62, pulse 72, temperature 98.3 F (36.8 C), temperature source Oral, resp. rate 16, SpO2 100%.   Vitals  labs and radiology finding personally reviewed  Review of Systems:    Pertinent positives include:  back pain.   Constitutional:  No weight loss, night sweats, Fevers, chills, fatigue, weight loss  HEENT:  No headaches, Difficulty swallowing,Tooth/dental problems,Sore throat,  No sneezing, itching, ear ache, nasal congestion, post nasal drip,  Cardio-vascular:  No chest pain, Orthopnea, PND, anasarca, dizziness, palpitations.no Bilateral lower extremity swelling  GI:  No heartburn, indigestion, abdominal pain, nausea, vomiting, diarrhea, change in bowel habits, loss of appetite, melena, blood  in stool, hematemesis Resp:  no shortness of breath at rest. No dyspnea on exertion, No excess mucus, no productive cough, No non-productive cough, No coughing up of blood.No change in color of mucus.No wheezing. Skin:  no rash or lesions. No jaundice GU:  no dysuria, change in color of urine, no urgency or frequency. No straining to urinate.  No flank pain.  Musculoskeletal:  No joint pain or no joint swelling. No decreased range of motion. No  Psych:  No change in mood or affect. No depression or anxiety. No memory loss.  Neuro: no localizing neurological complaints, no tingling, no weakness, no double vision, no gait abnormality, no slurred speech, no confusion  All systems reviewed and apart from HOPI all are negative _______________________________________________________________________________________________ Past Medical History:   Past Medical History:  Diagnosis Date   Sickle cell anemia with crisis [facility])       Past Surgical History:  Procedure Laterality Date   APPENDECTOMY     GALLBLADDER SURGERY     SPLENECTOMY, TOTAL      Social History:  Ambulatory   independently      reports that she has never smoked. She has never been exposed to tobacco smoke. She does not have any smokeless tobacco history on file. She reports that she does not drink alcohol and does not use drugs.     Family History:   Family History  Problem Relation Age of Onset   Sickle cell trait Mother    Sickle cell trait Father  Asthma Brother        multiple admits to ED, given nebulizer, sent home, no home meds   ______________________________________________________________________________________________ Allergies: No Known Allergies   Prior to Admission medications   Medication Sig Start Date End Date Taking? Authorizing Provider  acetaminophen  (TYLENOL ) 500 MG tablet Take 2 tablets (1,000 mg total) by mouth every 8 (eight) hours as needed for fever, mild pain (pain score 1-3)  or moderate pain (pain score 4-6). 05/04/23   Debbra Fairy, PA-C  benzonatate  (TESSALON ) 100 MG capsule Take 1 capsule (100 mg total) by mouth every 8 (eight) hours. 05/04/23   Debbra Fairy, PA-C  EC-NAPROXEN 500 MG EC tablet Take 500 mg by mouth 2 (two) times daily as needed (Pain). 05/03/23   [provider]  Glutamine , Sickle Cell, (ENDARI  PO) Take 1 Dose by mouth daily.    [provider]  hydroxyurea  (HYDREA ) 500 MG capsule Take 500 mg by mouth 3 (three) times daily. 06/13/17   [provider]  ibuprofen  (IBU) 600 MG tablet Take 1 tablet (600 mg total) by mouth every 8 (eight) hours as needed for moderate pain (pain score 4-6). 04/16/23   Trish Furl, MD  ondansetron  (ZOFRAN -ODT) 4 MG disintegrating tablet 4mg  ODT q4 hours prn nausea/vomit 05/05/23   Zammit, Joseph, MD  oxycodone  (OXY-IR) 5 MG capsule Take 2 capsules (10 mg total) by mouth every 6 (six) hours as needed. Patient not taking: Reported on 05/05/2023 04/16/23   Trish Furl, MD  oxyCODONE -acetaminophen  (PERCOCET) 5-325 MG tablet Take 1 tablet by mouth every 6 (six) hours as needed. 05/05/23   Zammit, Joseph, MD  penicillin  v potassium (VEETID) 250 MG tablet Take 250 mg by mouth 2 times daily at 12 noon and 4 pm. 04/10/16   [provider]    ___________________________________________________________________________________________________ Physical Exam:    07/14/2023    5:58 PM 07/14/2023    2:45 PM 05/05/2023    3:15 PM  Vitals with BMI  Systolic 105 101 94  Diastolic 62 58 61  Pulse 72 79 60     1. General:  in No  Acute distress   Chronically ill   -appearing 2. Psychological: Alert and   Oriented 3. Head/ENT:   Moist   Mucous Membranes                          Head Non traumatic, neck supple                          Normal   Dentition 4. SKIN:  decreased Skin turgor,  Skin clean Dry and intact no rash    5. Heart: Regular rate and rhythm no  Murmur, no Rub or gallop 6. Lungs:  Clear to auscultation  bilaterally, no wheezes or crackles   7. Abdomen: Soft,  non-tender, Non distended bowel sounds present 8. Lower extremities: no clubbing, cyanosis, no  edema 9. Neurologically Grossly intact, moving all 4 extremities equally   10. MSK: Normal range of motion    Chart has been reviewed  ______________________________________________________________________________________________  Assessment/Plan 18 y.o. female with medical history significant of Sickle cell, acute chest , asplenia  Admitted for   Sickle cell pain crisis    Present on Admission:  Sickle cell pain crisis (HCC)  Sickle cell crisis (HCC)  Back pain     Sickle cell crisis (HCC) - will admit per sickle cell protocol,    control pain,  hydrate with IVF D5 .45% Saline @ 100 mls/hour,    Weight based Dilaudid  PCA for opioid tolerant patients.        Transfuse as needed if Hg drops significantly below baseline.    No evidence of acute chest at this time   Sickle cell team to take over management in AM   Functional asplenia Continue penicillin    Back pain Likely due to sickle pain  Given hematuria will order renal CT  Non acute Constipation that will be treated with bowel regimen      Other plan as per orders.  DVT prophylaxis:  SCD      Code Status:    Code Status: Prior FULL CODE  as per patient   I had personally discussed CODE STATUS with patient   ACP   none    Family Communication:   Family not at  Bedside    Diet heart healthy   Disposition Plan:      To home once workup is complete and patient is stable   Following barriers for discharge:                                                          Pain controlled with PO medications                                      Consult Orders  (From admission, onward)           Start     Ordered   07/14/23 2028  Consult to hospitalist  Once       Provider:  (Not yet assigned)  Question Answer Comment  Place call to: [facility]    Reason for Consult Admit      07/14/23 2027             Admission status:  ED Disposition     ED Disposition  Admit   Condition  --   Comment  Hospital Area: [facility] [[id number]]  Level of Care: Med-Surg [16]  May admit patient to [facility] or [facility] if equivalent level of care is available:: No  Covid Evaluation: Asymptomatic - no recent exposure (last 10 days) testing not required  Diagnosis: Sickle cell pain crisis [facility]) [[id number]]  Admitting Physician: Briton Sellman [[id number]]  Attending Physician: Ashtan Girtman [[id number]]  Certification:: I certify this patient will need inpatient services for at least 2 midnights  Expected Medical Readiness: 07/18/2023            inpatient     I Expect 2 midnight stay secondary to severity of patient's current illness need for inpatient interventions justified by the following:     Severe lab/radiological/exam abnormalities including:    Sickle cell pain crisis   and extensive comorbidities including:   sickle cell disease   That are currently affecting medical management.   I expect  patient to be hospitalized for 2 midnights requiring inpatient medical care.  Patient is at high risk for adverse outcome (such as loss of life or disability) if not treated.  Indication for inpatient stay as follows:   severe pain requiring acute inpatient management,    Need for  IV fluids,  PCA  Level of care medical floor        Selene Dais 07/14/2023, 9:37 PM    [facility]     after 2 AM please page floor coverage   If 7AM-7PM, please contact the day team taking care of the patient using [email or web address]

## 2023-07-14 NOTE — Subjective & Objective (Signed)
 Presents with low back pain consistent with her prior sickle cell crisis presentation Has ran out of her narcotics at home Usually goes to [facility]. No fevers or chills no chest pain or shortness of breath No neurological complaints Supposed to be on hydroxyurea  supposed to be on Percocet every 6 hours as needed

## 2023-07-14 NOTE — Assessment & Plan Note (Signed)
-   will admit per sickle cell protocol,    control pain,    hydrate with IVF D5 .45% Saline @ 100 mls/hour,    Weight based Dilaudid PCA for opioid tolerant patients.      Transfuse as needed if Hg drops significantly below baseline.    No evidence of acute chest at this time   Sickle cell team to take over management in AM

## 2023-07-14 NOTE — Assessment & Plan Note (Addendum)
 Likely due to sickle pain  Given hematuria will order renal CT  Non acute Constipation that will be treated with bowel regimen

## 2023-07-14 NOTE — ED Provider Triage Note (Signed)
 Emergency Medicine Provider Triage Evaluation Note  RECHELL VORWERK , a 18 y.o. female  was evaluated in triage.  Pt complains of sickle cell pain.  Pain to lower back.  No numbness or weakness.  No dysuria or hematuria.  No fevers, cough.  Has been out of her home pain medicine.  She states she was going to call her clinic to get a refill of her medicine however needs "IV medicine."  And feels consistent with her prior sickle cell crises  Review of Systems  Positive: Back pain Negative:   Physical Exam  BP (!) 101/58   Pulse 79   Temp 98.7 F (37.1 C) (Oral)   Resp 16   SpO2 100%  Gen:   Awake, no distress   Resp:  Normal effort  MSK:   Moves extremities without difficulty  Other:    Medical Decision Making  Medically screening exam initiated at 3:44 PM.  Appropriate orders placed.  [name] was informed that the remainder of the evaluation will be completed by another provider, this initial triage assessment does not replace that evaluation, and the importance of remaining in the ED until their evaluation is complete.  SSC, low back pain  Declined oral meds   Shalah Estelle A, PA-C 07/14/23 1545

## 2023-07-14 NOTE — Assessment & Plan Note (Signed)
Continue penicillin

## 2023-07-14 NOTE — ED Provider Notes (Signed)
 [place] EMERGENCY DEPARTMENT AT [facility] Provider Note   CSN: [id number] Arrival date & time: 07/14/23  1411     History  Chief Complaint  Patient presents with   Sickle Cell Pain Crisis    Pt endorses sickle cell pain crisis that started today. Pain located in her back, endorses some shortness of breath, denies chest pain. 8/10 pain at this time     Janet Stone is a 18 y.o. female here for evaluation lower back pain.  Feels consistent with her prior sickle cell crises.  Has been out of her home narcotic pain medicine.  Follows with [facility].  No recent trauma or injury.  No fever, chest pain, shortness of breath, cough, abdominal pain, numbness or weakness.  Ambulatory here.  Did not contact her primary care provider to get her home meds filled.  HPI     Home Medications Prior to Admission medications   Medication Sig Start Date End Date Taking? Authorizing Provider  acetaminophen  (TYLENOL ) 500 MG tablet Take 2 tablets (1,000 mg total) by mouth every 8 (eight) hours as needed for fever, mild pain (pain score 1-3) or moderate pain (pain score 4-6). 05/04/23   Debbra Fairy, PA-C  benzonatate  (TESSALON ) 100 MG capsule Take 1 capsule (100 mg total) by mouth every 8 (eight) hours. 05/04/23   Debbra Fairy, PA-C  EC-NAPROXEN 500 MG EC tablet Take 500 mg by mouth 2 (two) times daily as needed (Pain). 05/03/23   [provider]  Glutamine , Sickle Cell, (ENDARI  PO) Take 1 Dose by mouth daily.    [provider]  hydroxyurea  (HYDREA ) 500 MG capsule Take 500 mg by mouth 3 (three) times daily. 06/13/17   [provider]  ibuprofen  (IBU) 600 MG tablet Take 1 tablet (600 mg total) by mouth every 8 (eight) hours as needed for moderate pain (pain score 4-6). 04/16/23   Trish Furl, MD  ondansetron  (ZOFRAN -ODT) 4 MG disintegrating tablet 4mg  ODT q4 hours prn nausea/vomit 05/05/23   Zammit, Joseph, MD  oxycodone  (OXY-IR) 5 MG capsule Take 2 capsules (10 mg total)  by mouth every 6 (six) hours as needed. Patient not taking: Reported on 05/05/2023 04/16/23   Trish Furl, MD  oxyCODONE -acetaminophen  (PERCOCET) 5-325 MG tablet Take 1 tablet by mouth every 6 (six) hours as needed. 05/05/23   Zammit, Joseph, MD  penicillin  v potassium (VEETID) 250 MG tablet Take 250 mg by mouth 2 times daily at 12 noon and 4 pm. 04/10/16   [provider]      Allergies    Patient has no known allergies.    Review of Systems   Review of Systems  Constitutional: Negative.   HENT: Negative.    Respiratory: Negative.    Cardiovascular: Negative.   Gastrointestinal: Negative.   Genitourinary: Negative.   Musculoskeletal:  Positive for back pain.  Skin: Negative.   Neurological: Negative.   All other systems reviewed and are negative.   Physical Exam Updated Vital Signs BP 110/61 (BP Location: Left Arm)   Pulse 67   Temp 98.5 F (36.9 C)   Resp 18   SpO2 99%  Physical Exam Vitals and nursing note reviewed.  Constitutional:      General: She is not in acute distress.    Appearance: She is well-developed. She is not ill-appearing, toxic-appearing or diaphoretic.  HENT:     Head: Atraumatic.  Eyes:     Pupils: Pupils are equal, round, and reactive to light.  Cardiovascular:  Rate and Rhythm: Normal rate.     Pulses: Normal pulses.     Heart sounds: Normal heart sounds.  Pulmonary:     Effort: Pulmonary effort is normal. No respiratory distress.     Breath sounds: Normal breath sounds.  Abdominal:     General: Bowel sounds are normal. There is no distension.     Palpations: Abdomen is soft.     Tenderness: There is no abdominal tenderness. There is no guarding or rebound.  Musculoskeletal:        General: No swelling, tenderness, deformity or signs of injury. Normal range of motion.     Cervical back: Normal range of motion.     Right lower leg: No edema.     Left lower leg: No edema.  Skin:    General: Skin is warm and dry.     Capillary Refill:  Capillary refill takes less than 2 seconds.  Neurological:     General: No focal deficit present.     Mental Status: She is alert.     Cranial Nerves: No cranial nerve deficit.     Sensory: No sensory deficit.     Motor: No weakness.     Gait: Gait normal.  Psychiatric:        Mood and Affect: Mood normal.     ED Results / Procedures / Treatments   Labs (all labs ordered are listed, but only abnormal results are displayed) Labs Reviewed  COMPREHENSIVE METABOLIC PANEL WITH GFR - Abnormal; Notable for the following components:      Result Value   CO2 21 (*)    Alkaline Phosphatase 31 (*)    All other components within normal limits  CBC WITH DIFFERENTIAL/PLATELET - Abnormal; Notable for the following components:   RBC 3.28 (*)    Hemoglobin 10.1 (*)    HCT 29.8 (*)    All other components within normal limits  URINALYSIS, ROUTINE W REFLEX MICROSCOPIC - Abnormal; Notable for the following components:   APPearance HAZY (*)    Hgb urine dipstick LARGE (*)    Leukocytes,Ua TRACE (*)    Bacteria, UA FEW (*)    All other components within normal limits  RETICULOCYTES - Abnormal; Notable for the following components:   Retic Ct Pct 4.5 (*)    RBC. 3.28 (*)    Immature Retic Fract 26.8 (*)    All other components within normal limits  PREGNANCY, URINE  CK  MAGNESIUM   PHOSPHORUS  LACTATE DEHYDROGENASE    EKG None  Radiology CT Renal Stone Study Result Date: 07/14/2023 CLINICAL DATA:  Abdominal/flank pain, stone suspected Sickle cell pain.  Low back pain.  No fever or cough. EXAM: CT ABDOMEN AND PELVIS WITHOUT CONTRAST TECHNIQUE: Multidetector CT imaging of the abdomen and pelvis was performed following the standard protocol without IV contrast. RADIATION DOSE REDUCTION: This exam was performed according to the departmental dose-optimization program which includes automated exposure control, adjustment of the mA and/or kV according to patient size and/or use of iterative  reconstruction technique. COMPARISON:  Chest CTA 11/26/2022. FINDINGS: Lower chest: Stable mild scarring at both lung bases. No significant pleural or pericardial effusion. Hepatobiliary: No focal hepatic abnormalities on noncontrast imaging. There is no evidence of significant biliary dilatation status post cholecystectomy. Pancreas: Unremarkable. No pancreatic ductal dilatation or surrounding inflammatory changes. 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 Adrenals/Urinary Tract: Both adrenal glands appear normal. No evidence of urinary tract calculus, suspicious renal lesion or hydronephrosis. The bladder appears  unremarkable for its degree of distention. Stomach/Bowel: No enteric contrast administered. The stomach appears unremarkable for its degree of distension. No evidence of bowel wall thickening, distention or surrounding inflammatory change. Retrocecal surgical clips suggesting previous appendectomy. Mildly prominent stool throughout the colon. Vascular/Lymphatic: There are no enlarged abdominal or pelvic lymph nodes. No significant vascular findings on noncontrast imaging. Reproductive: The uterus and ovaries appear unremarkable. No evidence of adnexal mass. Other: No evidence of abdominal wall mass or hernia. No ascites or pneumoperitoneum. 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 Additional bone infarcts are noted within the bony pelvis and thoracolumbar spine. No evidence of acute fracture. IMPRESSION: 1. No acute findings or explanation for the patient's symptoms. No evidence of urinary tract calculus or hydronephrosis. 2. Mildly prominent stool throughout the colon suggesting constipation. 3. 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 Additional bone infarcts within the bony pelvis and thoracolumbar spine attributed to underlying sickle cell. 4. Postsurgical changes as described. Electronically Signed   By: Elmon Hagedorn M.D.   On: 07/14/2023 21:17    Procedures .Critical Care  Performed by: Dickson Founds, PA-C Authorized by: Dickson Founds, PA-C   Critical care provider statement:    Critical care time (minutes):  32   Critical care was necessary to treat or prevent imminent or life-threatening deterioration of the following conditions:  Dehydration and circulatory failure (sickle cell crisis)   Critical care was time spent personally by me on the following activities:  Development of treatment plan with patient or surrogate, discussions with consultants, evaluation of patient's response to treatment, examination of patient, ordering and review of laboratory studies, ordering and review of radiographic studies, ordering and performing treatments and interventions, pulse oximetry, re-evaluation of patient's condition and review of old charts     Medications Ordered in ED Medications  ondansetron  (ZOFRAN ) injection 4 mg (has no administration in time range)  hydroxyurea  (HYDREA ) capsule 500 mg (has no administration in time range)  penicillin  v potassium (VEETID) tablet 250 mg (has no administration in time range)  naloxone  (NARCAN ) injection 0.4 mg (has no administration in time range)    And  sodium chloride  flush (NS) 0.9 % injection 9 mL (has no administration in time range)  diphenhydrAMINE  (BENADRYL ) capsule 25 mg (has no administration in time range)  HYDROmorphone  (DILAUDID ) 1 mg/mL PCA injection (has no administration in time range)  HYDROmorphone  (DILAUDID ) injection 0.5 mg (0.5 mg Intravenous Given 07/14/23 1838)  HYDROmorphone  (DILAUDID ) injection 1 mg (1 mg Intravenous Given 07/14/23 1911)  HYDROmorphone  (DILAUDID ) injection 1 mg (1 mg Intravenous Given 07/14/23 2013)  diphenhydrAMINE  (BENADRYL ) 12.5 mg in sodium chloride  0.9 % 50 mL IVPB (12.5 mg Intravenous New Bag/Given 07/14/23 1912)  ketorolac  (TORADOL ) 15 MG/ML injection 15 mg (15 mg Intravenous Given 07/14/23 1815)    ED  Course/ Medical Decision Making/ A&P Clinical Course as of 07/14/23 2133  Mon Jul 14, 2023  2044 Dr. Hendrick Locke with medicine for admission [BH]    Clinical Course User Index [BH] Everett Ricciardelli A, PA-C   Here for lower back pain.  History of sickle cell.  Feels consistent with sickle cell crises.  No recent injury or trauma.  No abdominal pain, chest pain.  No numbness or weakness.  Has been out of her home meds.  Initially offered home oral meds however declined prefers IV.  Will check basic labs.  She is no clinical evidence of VTE on exam.  Labs personally viewed and interpreted:  Pregnancy test negative  Metabolic panel without significant abnormality CBC without leukocytosis, hemoglobin 10.1--similar to prior UA Negative for infection  Patient reassessed.  Discussed labs and imaging.  Will give pain meds per standing orders  Patient reassesed.  Still having moderate pain despite 3 doses of pain medicine.  She feels like the weather is triggering her pain.  She is requesting admission.  Consult with Dr. Hendrick Locke with medicine who is agreeable to see for admission. Rec Ct stone study given hematuria.  Ct stone show no acute pathology does show osteonecrosis right hip due to underlying sickle cell  The patient appears reasonably stabilized for admission considering the current resources, flow, and capabilities available in the ED at this time, and I doubt any other [facility] requiring further screening and/or treatment in the ED prior to admission.                                Medical Decision Making Amount and/or Complexity of Data Reviewed External Data Reviewed: labs, radiology and notes. Labs: ordered. Decision-making details documented in ED Course. Radiology: ordered.  Risk OTC drugs. Prescription drug management. Parenteral controlled substances. Decision regarding hospitalization. Diagnosis or treatment significantly limited by social determinants of  health.         Final Clinical Impression(s) / ED Diagnoses Final diagnoses:  Sickle cell pain crisis [facility])    Rx / DC Orders ED Discharge Orders     None         Keno Caraway A, PA-C 07/14/23 2133    Karlyn Overman, MD 07/15/23 1226

## 2023-07-15 LAB — CBC WITH DIFFERENTIAL/PLATELET
Abs Immature Granulocytes: 0.03 10*3/uL (ref 0.00–0.07)
Basophils Absolute: 0 10*3/uL (ref 0.0–0.1)
Basophils Relative: 0 %
Eosinophils Absolute: 0.1 10*3/uL (ref 0.0–0.5)
Eosinophils Relative: 2 %
HCT: 24.4 % — ABNORMAL LOW (ref 36.0–46.0)
Hemoglobin: 8.4 g/dL — ABNORMAL LOW (ref 12.0–15.0)
Immature Granulocytes: 0 %
Lymphocytes Relative: 24 %
Lymphs Abs: 2 10*3/uL (ref 0.7–4.0)
MCH: 31.1 pg (ref 26.0–34.0)
MCHC: 34.4 g/dL (ref 30.0–36.0)
MCV: 90.4 fL (ref 80.0–100.0)
Monocytes Absolute: 0.8 10*3/uL (ref 0.1–1.0)
Monocytes Relative: 10 %
Neutro Abs: 5.3 10*3/uL (ref 1.7–7.7)
Neutrophils Relative %: 64 %
Platelets: 331 10*3/uL (ref 150–400)
RBC: 2.7 MIL/uL — ABNORMAL LOW (ref 3.87–5.11)
RDW: 14.6 % (ref 11.5–15.5)
WBC: 8.3 10*3/uL (ref 4.0–10.5)
nRBC: 0 % (ref 0.0–0.2)

## 2023-07-15 LAB — COMPREHENSIVE METABOLIC PANEL WITH GFR
ALT: 64 U/L — ABNORMAL HIGH (ref 0–44)
AST: 117 U/L — ABNORMAL HIGH (ref 15–41)
Albumin: 3.7 g/dL (ref 3.5–5.0)
Alkaline Phosphatase: 29 U/L — ABNORMAL LOW (ref 38–126)
Anion gap: 6 (ref 5–15)
BUN: 6 mg/dL (ref 6–20)
CO2: 21 mmol/L — ABNORMAL LOW (ref 22–32)
Calcium: 7.9 mg/dL — ABNORMAL LOW (ref 8.9–10.3)
Chloride: 104 mmol/L (ref 98–111)
Creatinine, Ser: 0.52 mg/dL (ref 0.44–1.00)
GFR, Estimated: >60 mL/min (ref >60)
Glucose, Bld: 490 mg/dL — ABNORMAL HIGH (ref 70–99)
Potassium: 3 mmol/L — ABNORMAL LOW (ref 3.5–5.1)
Sodium: 131 mmol/L — ABNORMAL LOW (ref 135–145)
Total Bilirubin: 2 mg/dL — ABNORMAL HIGH (ref 0.0–1.2)
Total Protein: 6.2 g/dL — ABNORMAL LOW (ref 6.5–8.1)

## 2023-07-15 LAB — HIV ANTIBODY (ROUTINE TESTING W REFLEX): HIV Screen 4th Generation wRfx: NONREACTIVE

## 2023-07-15 MED ORDER — SODIUM CHLORIDE 0.9 % IV BOLUS
250.0000 mL | Freq: Once | INTRAVENOUS | Status: AC
Start: 2023-07-15 — End: 2023-07-15
  Administered 2023-07-15: 250 mL via INTRAVENOUS

## 2023-07-15 MED ORDER — SODIUM CHLORIDE 0.45 % IV SOLN
INTRAVENOUS | Status: DC
Start: 2023-07-15 — End: 2023-07-16

## 2023-07-15 NOTE — TOC CM/SW Note (Signed)
 Transition of Care [facility]) - Inpatient Brief Assessment   Patient Details  Name: LATONJA BRESSAN MRN: [id number] Date of Birth: 2005-11-22  Transition of Care [facility]) CM/SW Contact:    Kathryn Parish, RN Phone Number: 07/15/2023, 2:55 PM   Clinical Narrative:  No TOC needs identified during visits. TOC signing off  Transition of Care Asessment: Insurance and Status: Insurance coverage has been reviewed Patient has primary care physician: Yes Home environment has been reviewed: mobile home Prior level of function:: independent Prior/Current Home Services: No current home services Social Drivers of Health Review: SDOH reviewed no interventions necessary Readmission risk has been reviewed: Yes Transition of care needs: no transition of care needs at this time

## 2023-07-15 NOTE — Plan of Care (Signed)

## 2023-07-15 NOTE — Progress Notes (Signed)
 Patient ID: Janet Stone, female   DOB: 10/30/2005, 18 y.o.   MRN: [id number] Subjective: Janet Stone is an 18 year old female with a medical history significant for sickle cell anemia presents with pain in her lower back, chest, and arm. Patient was experiencing pain in her upper back, chest, right and also reports she also has been having shortness of breath, There is no cough, fevers, or chills.  Symptoms are similar to her regular sickle cell pain crisis.  Patient continues to report pain of 8/10.  No new concerns.  Encouraged to ambulate.  Objective:  Vital signs in last 24 hours:  Vitals:   07/15/23 0745 07/15/23 0820 07/15/23 1145 07/15/23 1243  BP: (!) 107/50  (!) 96/58 (!) 97/48  Pulse: (!) 51  61 (!) 52  Resp: 11 10 12 18   Temp: 97.7 F (36.5 C)  97.9 F (36.6 C) 98.2 F (36.8 C)  TempSrc: Oral  Oral Oral  SpO2: 98%  100% 96%    Intake/Output from previous day:   Intake/Output Summary (Last 24 hours) at 07/15/2023 1521 Last data filed at 07/15/2023 1235 Gross per 24 hour  Intake 1403.94 ml  Output --  Net 1403.94 ml    Physical Exam: General: Alert, awake, oriented x3, in no acute distress.  HEENT: /AT PEERL, EOMI Neck: Trachea midline,  no masses, no thyromegal,y no JVD, no carotid bruit OROPHARYNX:  Moist, No exudate/ erythema/lesions.  Heart: Regular rate and rhythm, without murmurs, rubs, gallops, PMI non-displaced, no heaves or thrills on palpation.  Lungs: Clear to auscultation, no wheezing or rhonchi noted. No increased vocal fremitus resonant to percussion  Abdomen: Soft, nontender, nondistended, positive bowel sounds, no masses no hepatosplenomegaly noted..  Neuro: No focal neurological deficits noted cranial nerves II through XII grossly intact. DTRs 2+ bilaterally upper and lower extremities. Strength 5 out of 5 in bilateral upper and lower extremities. Musculoskeletal: Upper back pain, chest pain,  psychiatric: Patient alert and oriented x3, good  insight and cognition, good recent to remote recall. Lymph node survey: No cervical axillary or inguinal lymphadenopathy noted.  Lab Results:  Basic Metabolic Panel:    Component Value Date/Time   NA 131 (L) 07/15/2023 0435   K 3.0 (L) 07/15/2023 0435   CL 104 07/15/2023 0435   CO2 21 (L) 07/15/2023 0435   BUN 6 07/15/2023 0435   CREATININE 0.52 07/15/2023 0435   GLUCOSE 490 (H) 07/15/2023 0435   CALCIUM 7.9 (L) 07/15/2023 0435   CBC:    Component Value Date/Time   WBC 8.3 07/15/2023 0435   HGB 8.4 (L) 07/15/2023 0435   HCT 24.4 (L) 07/15/2023 0435   PLT 331 07/15/2023 0435   MCV 90.4 07/15/2023 0435   NEUTROABS 5.3 07/15/2023 0435   LYMPHSABS 2.0 07/15/2023 0435   MONOABS 0.8 07/15/2023 0435   EOSABS 0.1 07/15/2023 0435   BASOSABS 0.0 07/15/2023 0435    No results found for this or any previous visit (from the past 240 hours).  Studies/Results: CT Renal Stone Study Result Date: 07/14/2023 CLINICAL DATA:  Abdominal/flank pain, stone suspected Sickle cell pain.  Low back pain.  No fever or cough. EXAM: CT ABDOMEN AND PELVIS WITHOUT CONTRAST TECHNIQUE: Multidetector CT imaging of the abdomen and pelvis was performed following the standard protocol without IV contrast. RADIATION DOSE REDUCTION: This exam was performed according to the departmental dose-optimization program which includes automated exposure control, adjustment of the mA and/or kV according to patient size and/or use of iterative reconstruction technique. COMPARISON:  Chest CTA 11/26/2022. FINDINGS: Lower chest: Stable mild scarring at both lung bases. No significant pleural or pericardial effusion. Hepatobiliary: No focal hepatic abnormalities on noncontrast imaging. There is no evidence of significant biliary dilatation status post cholecystectomy. Pancreas: Unremarkable. No pancreatic ductal dilatation or surrounding inflammatory changes. 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  Adrenals/Urinary Tract: Both adrenal glands appear normal. No evidence of urinary tract calculus, suspicious renal lesion or hydronephrosis. The bladder appears unremarkable for its degree of distention. Stomach/Bowel: No enteric contrast administered. The stomach appears unremarkable for its degree of distension. No evidence of bowel wall thickening, distention or surrounding inflammatory change. Retrocecal surgical clips suggesting previous appendectomy. Mildly prominent stool throughout the colon. Vascular/Lymphatic: There are no enlarged abdominal or pelvic lymph nodes. No significant vascular findings on noncontrast imaging. Reproductive: The uterus and ovaries appear unremarkable. No evidence of adnexal mass. Other: No evidence of abdominal wall mass or hernia. No ascites or pneumoperitoneum. 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 Additional bone infarcts are noted within the bony pelvis and thoracolumbar spine. No evidence of acute fracture. IMPRESSION: 1. No acute findings or explanation for the patient's symptoms. No evidence of urinary tract calculus or hydronephrosis. 2. Mildly prominent stool throughout the colon suggesting constipation. 3. 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 Additional bone infarcts within the bony pelvis and thoracolumbar spine attributed to underlying sickle cell. 4. Postsurgical changes as described. Electronically Signed   By: Elmon Hagedorn M.D.   On: 07/14/2023 21:17    Medications: Scheduled Meds:  HYDROmorphone    Intravenous Q4H   hydroxyurea   500 mg Oral TID   ketorolac   15 mg Intravenous Q6H   penicillin  v potassium  250 mg Oral BID   senna-docusate  1 tablet Oral BID   Continuous Infusions:  sodium chloride  100 mL/hr at 07/15/23 1003   PRN Meds:.bisacodyl , diphenhydrAMINE , hydrOXYzine, naloxone  **AND** sodium chloride  flush, ondansetron , polyethylene  glycol  Consultants: None  Procedures: None  Antibiotics: None  Assessment/Plan: Principal Problem:   Sickle cell pain crisis (HCC) Active Problems:   Functional asplenia   Sickle cell crisis (HCC)   Back pain   Hb Sickle Cell Disease with Pain crisis: Continue IVF 0.45% Saline @ 125 mls/hour, continue weight based Dilaudid  PCA, IV Toradol  15 mg Q 6 H for a total of 5 days, continue oral home pain medications as ordered. Monitor vitals very closely, Re-evaluate pain scale regularly, 2 L of Oxygen by [facility]. Patient encouraged to ambulate on the hallway today.  Leukocytosis: Stable at 8.3 g/dL Anemia of Chronic Disease: Hemoglobin 8.4 g/dL lower than patient's baseline.  No need for transfusion at this time, will continue to monitor.  Daily CBC in place. Chronic pain Syndrome: Continue oral home pain medication. Back pain: Pain 8/10 continue pain medication as prescribed.   Code Status: Full Code Family Communication: N/A Disposition Plan: Not yet ready for discharge  Lorel Roes NP  If 7PM-7AM, please contact night-coverage.  07/15/2023, 3:21 PM  LOS: 1 day

## 2023-07-15 NOTE — Inpatient Diabetes Management (Signed)
 Inpatient Diabetes Program Recommendations  AACE/ADA: New Consensus Statement on Inpatient Glycemic Control (2015)  Target Ranges:  Prepandial:   less than 140 mg/dL      Peak postprandial:   less than 180 mg/dL (1-2 hours)      Critically ill patients:  140 - 180 mg/dL    Review of Glycemic Control  Latest Reference Range & Units 07/15/23 04:35  Glucose 70 - 99 mg/dL 161 (H)  (H): Data is abnormally high Diabetes history: No DM hx Outpatient Diabetes medications: none Current orders for Inpatient glycemic control: none  Inpatient Diabetes Program Recommendations:   Noted glucose serum this AM exceeding 400's mg/dL. Patient also had Dextrose  in IV fluids. However, may want to add CBGs to further assess? Given reason for admission doubt A1C would be accurate.  Thanks, Marjo Sievert, MSN, RNC-OB Diabetes Coordinator [phone] (8a-5p)

## 2023-07-15 NOTE — Plan of Care (Signed)
  Problem: Self-Care: Goal: Ability to incorporate actions that prevent/reduce pain crisis will improve Outcome: Progressing   Problem: Respiratory: Goal: Pulmonary complications will be avoided or minimized Outcome: Progressing   Problem: Activity: Goal: Risk for activity intolerance will decrease Outcome: Progressing   Problem: Nutrition: Goal: Adequate nutrition will be maintained Outcome: Progressing   Problem: Safety: Goal: Ability to remain free from injury will improve Outcome: Progressing   Problem: Skin Integrity: Goal: Risk for impaired skin integrity will decrease Outcome: Progressing

## 2023-07-16 MED ORDER — OXYCODONE-ACETAMINOPHEN 5-325 MG PO TABS
1.0000 | ORAL_TABLET | Freq: Four times a day (QID) | ORAL | Status: DC | PRN
  Administered 2023-07-16 – 2023-07-18 (×4): 1 via ORAL
  Filled 2023-07-16 (×4): qty 1

## 2023-07-16 MED ORDER — POTASSIUM CHLORIDE 20 MEQ PO PACK: 40.0000 meq | PACK | Freq: Every day | ORAL | Status: DC

## 2023-07-16 MED ORDER — HYDROMORPHONE 1 MG/ML IV SOLN
INTRAVENOUS | Status: DC
  Administered 2023-07-16: 1.2 mg via INTRAVENOUS
  Administered 2023-07-16: 2.4 mg via INTRAVENOUS
  Administered 2023-07-16: 30 mg via INTRAVENOUS
  Administered 2023-07-17: 7 mg via INTRAVENOUS
  Administered 2023-07-17: 2.1 mg via INTRAVENOUS
  Administered 2023-07-17: 0.3 mg via INTRAVENOUS
  Administered 2023-07-17 (×2): 1 mg via INTRAVENOUS
  Administered 2023-07-18: 10 mg via INTRAVENOUS
  Administered 2023-07-18: 3.9 mg via INTRAVENOUS
  Administered 2023-07-18: 10 mg via INTRAVENOUS
  Filled 2023-07-16 (×2): qty 30

## 2023-07-16 MED ORDER — POTASSIUM CHLORIDE 20 MEQ PO PACK
40.0000 meq | PACK | Freq: Every day | ORAL | Status: DC
  Administered 2023-07-16 – 2023-07-19 (×4): 40 meq via ORAL
  Filled 2023-07-16 (×4): qty 2

## 2023-07-16 MED ORDER — HYDROXYUREA 500 MG PO CAPS: 500.0000 mg | ORAL_CAPSULE | Freq: Three times a day (TID) | ORAL | Status: DC

## 2023-07-16 NOTE — Progress Notes (Cosign Needed Addendum)
 Patient ID: Janet Stone, female   DOB: Nov 20, 2005, 18 y.o.   MRN: [id number] Subjective: Janet Stone is an 18 year old female with a medical history significant for sickle cell anemia presents with pain in her lower back, chest, and arm. Patient was experiencing pain in her upper back, chest, right and also reports she also has been having shortness of breath, There is no cough, fevers, or chills.  Symptoms are similar to her regular sickle cell pain crisis.  Patient reports slightly improved pain of 8/10. Her PCA was switched off over night due to losing her IV access.  No new concerns.  Encouraged to ambulate.  Objective:  Vital signs in last 24 hours:  Vitals:   07/16/23 1200 07/16/23 1208 07/16/23 1355 07/16/23 1552  BP:  109/73  112/68  Pulse:  92  89  Resp: 18  16   Temp:  98.4 F (36.9 C)  (!) 97.5 F (36.4 C)  TempSrc:      SpO2:  98%  98%    Intake/Output from previous day:   Intake/Output Summary (Last 24 hours) at 07/16/2023 1626 Last data filed at 07/16/2023 1500 Gross per 24 hour  Intake 1264.37 ml  Output --  Net 1264.37 ml    Physical Exam: General: Alert, awake, oriented x3, in no acute distress.  HEENT: [place]/AT PEERL, EOMI Neck: Trachea midline,  no masses, no thyromegal,y no JVD, no carotid bruit OROPHARYNX:  Moist, No exudate/ erythema/lesions.  Heart: Regular rate and rhythm, without murmurs, rubs, gallops, PMI non-displaced, no heaves or thrills on palpation.  Lungs: Clear to auscultation, no wheezing or rhonchi noted. No increased vocal fremitus resonant to percussion  Abdomen: Soft, nontender, nondistended, positive bowel sounds, no masses no hepatosplenomegaly noted..  Neuro: No focal neurological deficits noted cranial nerves II through XII grossly intact. DTRs 2+ bilaterally upper and lower extremities. Strength 5 out of 5 in bilateral upper and lower extremities. Musculoskeletal: Generalized body pain, chest pain, back pain. psychiatric: Patient  alert and oriented x3, good insight and cognition, good recent to remote recall. Lymph node survey: No cervical axillary or inguinal lymphadenopathy noted.  Lab Results:  Basic Metabolic Panel:    Component Value Date/Time   NA 131 (L) 07/15/2023 0435   K 3.0 (L) 07/15/2023 0435   CL 104 07/15/2023 0435   CO2 21 (L) 07/15/2023 0435   BUN 6 07/15/2023 0435   CREATININE 0.52 07/15/2023 0435   GLUCOSE 490 (H) 07/15/2023 0435   CALCIUM 7.9 (L) 07/15/2023 0435   CBC:    Component Value Date/Time   WBC 8.3 07/15/2023 0435   HGB 8.4 (L) 07/15/2023 0435   HCT 24.4 (L) 07/15/2023 0435   PLT 331 07/15/2023 0435   MCV 90.4 07/15/2023 0435   NEUTROABS 5.3 07/15/2023 0435   LYMPHSABS 2.0 07/15/2023 0435   MONOABS 0.8 07/15/2023 0435   EOSABS 0.1 07/15/2023 0435   BASOSABS 0.0 07/15/2023 0435    No results found for this or any previous visit (from the past 240 hours).  Studies/Results: CT Renal Stone Study Result Date: 07/14/2023 CLINICAL DATA:  Abdominal/flank pain, stone suspected Sickle cell pain.  Low back pain.  No fever or cough. EXAM: CT ABDOMEN AND PELVIS WITHOUT CONTRAST TECHNIQUE: Multidetector CT imaging of the abdomen and pelvis was performed following the standard protocol without IV contrast. RADIATION DOSE REDUCTION: This exam was performed according to the departmental dose-optimization program which includes automated exposure control, adjustment of the mA and/or kV according to patient size and/or  use of iterative reconstruction technique. COMPARISON:  Chest CTA 11/26/2022. FINDINGS: Lower chest: Stable mild scarring at both lung bases. No significant pleural or pericardial effusion. Hepatobiliary: No focal hepatic abnormalities on noncontrast imaging. There is no evidence of significant biliary dilatation status post cholecystectomy. Pancreas: Unremarkable. No pancreatic ductal dilatation or surrounding inflammatory changes. Spleen: Status post splenectomy with stable  subphrenic surgical clips. Adrenals/Urinary Tract: Both adrenal glands appear normal. No evidence of urinary tract calculus, suspicious renal lesion or hydronephrosis. The bladder appears unremarkable for its degree of distention. Stomach/Bowel: No enteric contrast administered. The stomach appears unremarkable for its degree of distension. No evidence of bowel wall thickening, distention or surrounding inflammatory change. Retrocecal surgical clips suggesting previous appendectomy. Mildly prominent stool throughout the colon. Vascular/Lymphatic: There are no enlarged abdominal or pelvic lymph nodes. No significant vascular findings on noncontrast imaging. Reproductive: The uterus and ovaries appear unremarkable. No evidence of adnexal mass. Other: No evidence of abdominal wall mass or hernia. No ascites or pneumoperitoneum. 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 Additional bone infarcts are noted within the bony pelvis and thoracolumbar spine. No evidence of acute fracture. IMPRESSION: 1. No acute findings or explanation for the patient's symptoms. No evidence of urinary tract calculus or hydronephrosis. 2. Mildly prominent stool throughout the colon suggesting constipation. 3. 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 Additional bone infarcts within the bony pelvis and thoracolumbar spine attributed to underlying sickle cell. 4. Postsurgical changes as described. Electronically Signed   By: Elmon Hagedorn M.D.   On: 07/14/2023 21:17    Medications: Scheduled Meds:  HYDROmorphone    Intravenous Q4H   hydroxyurea   500 mg Oral TID   ketorolac   15 mg Intravenous Q6H   penicillin  v potassium  250 mg Oral BID   potassium chloride   40 mEq Oral Daily   senna-docusate  1 tablet Oral BID   Continuous Infusions: PRN Meds:.bisacodyl , diphenhydrAMINE , hydrOXYzine, naloxone  **AND** sodium chloride  flush, ondansetron , oxyCODONE -acetaminophen , polyethylene  glycol  Consultants: None  Procedures: None  Antibiotics: None  Assessment/Plan: Principal Problem:   Sickle cell pain crisis (HCC) Active Problems:   Functional asplenia   Sickle cell crisis (HCC)   Back pain   Hb Sickle Cell Disease with Pain crisis: Continue IVF 0.45% Saline @kvo  mls/hour, continue weight based Dilaudid  PCA, IV Toradol  15 mg Q 6 H for a total of 5 days, continue oral home pain medications as ordered. Monitor vitals very closely, Re-evaluate pain scale regularly, 2 L of Oxygen by [place]. Patient encouraged to ambulate on the hallway today.  Leukocytosis: Stable at 8.3. Anemia of Chronic Disease: Hemoglobin stable at 8.4 g/dL.  Will continue to monitor daily CBC in place. Chronic pain Syndrome: Continue oral home pain medication. Back pain: Continue pain medicine as prescribed.   Code Status: Full Code Family Communication: N/A Disposition Plan: Not yet ready for discharge  Lorel Roes NP  If 7PM-7AM, please contact night-coverage.  07/16/2023, 4:26 PM  LOS: 2 days

## 2023-07-16 NOTE — Plan of Care (Signed)
  Problem: Respiratory: Goal: Pulmonary complications will be avoided or minimized Outcome: Progressing   Problem: Education: Goal: Knowledge of General Education information will improve Description: Including pain rating scale, medication(s)/side effects and non-pharmacologic comfort measures Outcome: Progressing   Problem: Elimination: Goal: Will not experience complications related to bowel motility Outcome: Progressing Goal: Will not experience complications related to urinary retention Outcome: Progressing   Problem: Pain Managment: Goal: General experience of comfort will improve and/or be controlled Outcome: Progressing   Problem: Safety: Goal: Ability to remain free from injury will improve Outcome: Progressing

## 2023-07-16 NOTE — Progress Notes (Signed)
 5 mL hydromorphone  PCA wasted with Karlynn Oyster, RN.

## 2023-07-17 LAB — BASIC METABOLIC PANEL WITH GFR
Anion gap: 14 (ref 5–15)
BUN: <5 mg/dL — ABNORMAL LOW (ref 6–20)
CO2: 20 mmol/L — ABNORMAL LOW (ref 22–32)
Calcium: 9.2 mg/dL (ref 8.9–10.3)
Chloride: 103 mmol/L (ref 98–111)
Creatinine, Ser: 0.4 mg/dL — ABNORMAL LOW (ref 0.44–1.00)
GFR, Estimated: >60 mL/min (ref >60)
Glucose, Bld: 77 mg/dL (ref 70–99)
Potassium: 3.5 mmol/L (ref 3.5–5.1)
Sodium: 137 mmol/L (ref 135–145)

## 2023-07-17 LAB — MAGNESIUM: Magnesium: 1.9 mg/dL (ref 1.7–2.4)

## 2023-07-17 MED ORDER — SODIUM CHLORIDE 0.45 % IV SOLN: INTRAVENOUS | Status: AC

## 2023-07-17 MED ORDER — DM-GUAIFENESIN ER 30-600 MG PO TB12
1.0000 | ORAL_TABLET | Freq: Two times a day (BID) | ORAL | Status: DC
  Administered 2023-07-17 – 2023-07-19 (×4): 1 via ORAL
  Filled 2023-07-17 (×4): qty 1

## 2023-07-17 NOTE — Plan of Care (Signed)

## 2023-07-17 NOTE — Progress Notes (Signed)
 Patient ID: Janet Stone, female   DOB: 05/04/2005, 18 y.o.   MRN: [id number]  Subjective: Janet Stone is an 18 year old female with a medical history significant for sickle cell anemia presents with pain in her lower back, chest, and arm. Patient was experiencing pain in her upper back, chest, right and also reports she also has been having shortness of breath, There is no cough, fevers, or chills.  Symptoms are similar to her regular sickle cell pain crisis.  She continues to report pain of 8/10. Her PCA , Slight head cold, cough and congestion. No fever, nausea or vomiting.  Objective:  Vital signs in last 24 hours:  Vitals:   07/18/23 0406 07/18/23 0817 07/18/23 1159 07/18/23 1232  BP: 107/66  105/62   Pulse: 74  72   Resp: 11 (!) 9 15 12   Temp: 97.9 F (36.6 C)  98.1 F (36.7 C)   TempSrc:      SpO2: 97% 97% 100% 97%    Intake/Output from previous day:   Intake/Output Summary (Last 24 hours) at 07/18/2023 1539 Last data filed at 07/18/2023 0817 Gross per 24 hour  Intake 541.5 ml  Output --  Net 541.5 ml    Physical Exam: General: Alert, awake, oriented x3, in no acute distress.  HEENT: [place]/AT PEERL, E Neck: Trachea midline,  no masses, no thyromegal,y no JVD, no carotid bruit OROPHARYNX:  Moist, No exudate/ erythema/lesions.  Heart: Regular rate and rhythm, without murmurs, rubs, gallops, PMI non-displaced, no heaves or thrills on palpation.  Lungs: Cough and congestion.   Abdomen: Soft, nontender, nondistended, positive bowel sounds, no masses no hepatosplenomegaly noted..  Neuro: No focal neurological deficits noted cranial nerves II through XII grossly intact. DTRs 2+ bilaterally upper and lower extremities. Strength 5 out of 5 in bilateral upper and lower extremities. Musculoskeletal: Generalized body pain, chest pain, back pain. Psychiatric: Patient alert and oriented x3, good insight and cognition, good recent to remote recall. Lymph node survey: No cervical  axillary or inguinal lymphadenopathy noted.  Lab Results:  Basic Metabolic Panel:    Component Value Date/Time   NA 134 (L) 07/18/2023 0406   K 3.0 (L) 07/18/2023 0406   CL 100 07/18/2023 0406   CO2 26 07/18/2023 0406   BUN 6 07/18/2023 0406   CREATININE 0.49 07/18/2023 0406   GLUCOSE 87 07/18/2023 0406   CALCIUM 8.9 07/18/2023 0406   CBC:    Component Value Date/Time   WBC 8.3 07/15/2023 0435   HGB 8.4 (L) 07/15/2023 0435   HCT 24.4 (L) 07/15/2023 0435   PLT 331 07/15/2023 0435   MCV 90.4 07/15/2023 0435   NEUTROABS 5.3 07/15/2023 0435   LYMPHSABS 2.0 07/15/2023 0435   MONOABS 0.8 07/15/2023 0435   EOSABS 0.1 07/15/2023 0435   BASOSABS 0.0 07/15/2023 0435    No results found for this or any previous visit (from the past 240 hours).  Studies/Results: No results found.  Medications: Scheduled Meds:  dextromethorphan-guaiFENesin  1 tablet Oral BID   HYDROmorphone    Intravenous Q4H   hydroxyurea   500 mg Oral TID   ketorolac   15 mg Intravenous Q6H   penicillin  v potassium  250 mg Oral BID   potassium chloride   40 mEq Oral Daily   senna-docusate  1 tablet Oral BID   Continuous Infusions:   PRN Meds:.bisacodyl , diphenhydrAMINE , hydrOXYzine, naloxone  **AND** sodium chloride  flush, ondansetron , oxyCODONE -acetaminophen , polyethylene glycol  Consultants: None  Procedures: None  Antibiotics: None  Assessment/Plan: Principal Problem:   Sickle cell  pain crisis (HCC) Active Problems:   Functional asplenia   Sickle cell crisis (HCC)   URI with cough and congestion   Hb Sickle Cell Disease with Pain crisis: Continue IVF 0.45% Saline @KVO  mls/hour, continue weight based Dilaudid  PCA, IV Toradol  15 mg Q 6 H for a total of 5 days, continue oral home pain medications as ordered. Monitor vitals very closely, Re-evaluate pain scale regularly, 2 L of Oxygen by [place]. Patient encouraged to ambulate on the hallway today.  Leukocytosis: Stable. Anemia of Chronic Disease:  Hemoglobin stable at 8.4 g/dL, will continue to monitor daily CBC. Chronic pain Syndrome: Continue oral home pain medication. Upper respiratory infection with cough and congestion symptoms: Dextromethorphan-guaifenesin [Mucinex D] 1 tablet twice daily.   Code Status: Full Code Family Communication: N/A Disposition Plan: Not yet ready for discharge  Lorel Roes NP  If 7PM-7AM, please contact night-coverage.  07/18/2023, 3:39 PM  LOS: 4 days

## 2023-07-18 ENCOUNTER — Encounter (HOSPITAL_COMMUNITY): Payer: Self-pay | Admitting: Internal Medicine

## 2023-07-18 DIAGNOSIS — J069 Acute upper respiratory infection, unspecified: Secondary | ICD-10-CM | POA: Insufficient documentation

## 2023-07-18 LAB — CBC
HCT: 25.5 % — ABNORMAL LOW (ref 36.0–46.0)
Hemoglobin: 8.9 g/dL — ABNORMAL LOW (ref 12.0–15.0)
MCH: 31.1 pg (ref 26.0–34.0)
MCHC: 34.9 g/dL (ref 30.0–36.0)
MCV: 89.2 fL (ref 80.0–100.0)
Platelets: 339 10*3/uL (ref 150–400)
RBC: 2.86 MIL/uL — ABNORMAL LOW (ref 3.87–5.11)
RDW: 14.4 % (ref 11.5–15.5)
WBC: 10.7 10*3/uL — ABNORMAL HIGH (ref 4.0–10.5)
nRBC: 0.2 % (ref 0.0–0.2)

## 2023-07-18 LAB — BASIC METABOLIC PANEL WITH GFR
Anion gap: 8 (ref 5–15)
BUN: 6 mg/dL (ref 6–20)
CO2: 26 mmol/L (ref 22–32)
Calcium: 8.9 mg/dL (ref 8.9–10.3)
Chloride: 100 mmol/L (ref 98–111)
Creatinine, Ser: 0.49 mg/dL (ref 0.44–1.00)
GFR, Estimated: >60 mL/min (ref >60)
Glucose, Bld: 87 mg/dL (ref 70–99)
Potassium: 3 mmol/L — ABNORMAL LOW (ref 3.5–5.1)
Sodium: 134 mmol/L — ABNORMAL LOW (ref 135–145)

## 2023-07-18 MED ORDER — HYDROMORPHONE 1 MG/ML IV SOLN
INTRAVENOUS | Status: DC
  Administered 2023-07-18: 3.5 mg via INTRAVENOUS
  Administered 2023-07-18: 30 mg via INTRAVENOUS
  Administered 2023-07-18: 1.5 mg via INTRAVENOUS
  Administered 2023-07-18: 2 mg via INTRAVENOUS
  Administered 2023-07-19: 4 mg via INTRAVENOUS
  Administered 2023-07-19 (×2): 5.5 mg via INTRAVENOUS
  Filled 2023-07-18: qty 30

## 2023-07-18 NOTE — Progress Notes (Signed)
 Patient ID: Janet Stone, female   DOB: January 11, 2006, 18 y.o.   MRN: [id number] Subjective: Janet Stone is an 18 year old female with a medical history significant for sickle cell anemia presents with pain in her lower back, chest, and arm. Patient was experiencing pain in her upper back, chest, right and also reports she also has been having shortness of breath, There is no cough, fevers, or chills.  Symptoms are similar to her regular sickle cell pain crisis.  She continues to report pain of 9/10 today. Her PCA dose adjusted to 0.5/10/3 from 0.3/10/1.25  Cough and congestion symptoms improved.  Patient will be ready for discharge in the a.m.  Objective:  Vital signs in last 24 hours:  Vitals:   07/18/23 0406 07/18/23 0817 07/18/23 1159 07/18/23 1232  BP: 107/66  105/62   Pulse: 74  72   Resp: 11 (!) 9 15 12   Temp: 97.9 F (36.6 C)  98.1 F (36.7 C)   TempSrc:      SpO2: 97% 97% 100% 97%    Intake/Output from previous day:   Intake/Output Summary (Last 24 hours) at 07/18/2023 1548 Last data filed at 07/18/2023 0817 Gross per 24 hour  Intake 541.5 ml  Output --  Net 541.5 ml    Physical Exam: General: Alert, awake, oriented x3, in no acute distress.  HEENT: [place]/AT PEERL, EOMI Neck: Trachea midline,  no masses, no thyromegal,y no JVD, no carotid bruit OROPHARYNX:  Moist, No exudate/ erythema/lesions.  Heart: Regular rate and rhythm, without murmurs, rubs, gallops, PMI non-displaced, no heaves or thrills on palpation.  Lungs: Cough and congestion.   Abdomen: Soft, nontender, nondistended, positive bowel sounds, no masses no hepatosplenomegaly noted..  Neuro: No focal neurological deficits noted cranial nerves II through XII grossly intact. DTRs 2+ bilaterally upper and lower extremities. Strength 5 out of 5 in bilateral upper and lower extremities. Musculoskeletal: Generalized body pain, chest pain, back pain. Psychiatric: Patient alert and oriented x3, good insight and  cognition, good recent to remote recall. Lymph node survey: No cervical axillary or inguinal lymphadenopathy noted.  Lab Results:  Basic Metabolic Panel:    Component Value Date/Time   NA 134 (L) 07/18/2023 0406   K 3.0 (L) 07/18/2023 0406   CL 100 07/18/2023 0406   CO2 26 07/18/2023 0406   BUN 6 07/18/2023 0406   CREATININE 0.49 07/18/2023 0406   GLUCOSE 87 07/18/2023 0406   CALCIUM 8.9 07/18/2023 0406   CBC:    Component Value Date/Time   WBC 8.3 07/15/2023 0435   HGB 8.4 (L) 07/15/2023 0435   HCT 24.4 (L) 07/15/2023 0435   PLT 331 07/15/2023 0435   MCV 90.4 07/15/2023 0435   NEUTROABS 5.3 07/15/2023 0435   LYMPHSABS 2.0 07/15/2023 0435   MONOABS 0.8 07/15/2023 0435   EOSABS 0.1 07/15/2023 0435   BASOSABS 0.0 07/15/2023 0435    No results found for this or any previous visit (from the past 240 hours).  Studies/Results: No results found.  Medications: Scheduled Meds:  dextromethorphan-guaiFENesin  1 tablet Oral BID   HYDROmorphone    Intravenous Q4H   hydroxyurea   500 mg Oral TID   ketorolac   15 mg Intravenous Q6H   penicillin  v potassium  250 mg Oral BID   potassium chloride   40 mEq Oral Daily   senna-docusate  1 tablet Oral BID   Continuous Infusions: PRN Meds:.bisacodyl , diphenhydrAMINE , hydrOXYzine, naloxone  **AND** sodium chloride  flush, ondansetron , oxyCODONE -acetaminophen , polyethylene glycol  Consultants: None  Procedures: None  Antibiotics: None  Assessment/Plan: Principal Problem:   Sickle cell pain crisis (HCC) Active Problems:   Functional asplenia   Sickle cell crisis (HCC)   URI with cough and congestion   Hb Sickle Cell Disease with Pain crisis: Continue IVF 0.45% Saline @KVO  mls/hour, continue weight based Dilaudid  PCA, IV Toradol  15 mg Q 6 H for a total of 5 days, continue oral home pain medications as ordered. Monitor vitals very closely, Re-evaluate pain scale regularly, 2 L of Oxygen by [place]. Patient encouraged to ambulate on the  hallway today.  Leukocytosis: Stable Anemia of Chronic Disease: Hemoglobin stable at 8.4 g/dL, will continue to monitor daily CBC. Chronic pain Syndrome: Continue oral home pain medication. Upper respiratory infection with cough and congestion symptoms: Dextromethorphan-guaifenesin [Mucinex D] 1 tablet twice daily.  Code Status: Full Code Family Communication: N/A Disposition Plan: Patient ready for discharge in the morning    Lorel Roes NP  If 7PM-7AM, please contact night-coverage.  07/18/2023, 3:48 PM  LOS: 4 days

## 2023-07-18 NOTE — Plan of Care (Signed)

## 2023-07-19 DIAGNOSIS — D5700 Hb-SS disease with crisis, unspecified: Secondary | ICD-10-CM | POA: Diagnosis not present

## 2023-07-19 LAB — BASIC METABOLIC PANEL WITH GFR
Anion gap: 6 (ref 5–15)
BUN: 6 mg/dL (ref 6–20)
CO2: 28 mmol/L (ref 22–32)
Calcium: 9.2 mg/dL (ref 8.9–10.3)
Chloride: 101 mmol/L (ref 98–111)
Creatinine, Ser: 0.51 mg/dL (ref 0.44–1.00)
GFR, Estimated: >60 mL/min (ref >60)
Glucose, Bld: 112 mg/dL — ABNORMAL HIGH (ref 70–99)
Potassium: 3.4 mmol/L — ABNORMAL LOW (ref 3.5–5.1)
Sodium: 135 mmol/L (ref 135–145)

## 2023-07-19 LAB — CBC
HCT: 24.4 % — ABNORMAL LOW (ref 36.0–46.0)
Hemoglobin: 8.5 g/dL — ABNORMAL LOW (ref 12.0–15.0)
MCH: 31.1 pg (ref 26.0–34.0)
MCHC: 34.8 g/dL (ref 30.0–36.0)
MCV: 89.4 fL (ref 80.0–100.0)
Platelets: 346 10*3/uL (ref 150–400)
RBC: 2.73 MIL/uL — ABNORMAL LOW (ref 3.87–5.11)
RDW: 14.6 % (ref 11.5–15.5)
WBC: 10.9 10*3/uL — ABNORMAL HIGH (ref 4.0–10.5)
nRBC: 0.4 % — ABNORMAL HIGH (ref 0.0–0.2)

## 2023-07-19 MED ORDER — OXYCODONE-ACETAMINOPHEN 5-325 MG PO TABS: 1.0000 | ORAL_TABLET | Freq: Four times a day (QID) | ORAL | 0 refills | Status: AC | PRN

## 2023-07-19 NOTE — Plan of Care (Signed)

## 2023-07-19 NOTE — Discharge Summary (Signed)
 Physician Discharge Summary   Patient: Janet Stone MRN: [id number] DOB: 01-Feb-2006  Admit date:     07/14/2023  Discharge date: 07/19/2023  Discharge Physician: Carolin Chyle   PCP: Hamilton Levin, MD   Recommendations at discharge:   Patient has done better and to follow-up with PCP.  Prescriptions were given.  Discharge Diagnoses: Principal Problem:   Sickle cell pain crisis (HCC) Active Problems:   Functional asplenia   Sickle cell crisis (HCC)   URI with cough and congestion  Resolved Problems:   * No resolved hospital problems. [facility] Course: Patient admitted with sickle cell painful crisis.  She also has functional asplenia and anemia of chronic disease.  Treated with Dilaudid  PCA, Toradol , IV fluids.  Pain improved.  Controlled and down to her baseline.  Patient requested home regimen which was given.  At the time of discharge she is back to baseline now and discharged home to follow-up with her PCP.  Assessment and Plan: No notes have been filed under this hospital service. Service: Hospitalist        Consultants: None Procedures performed: Chest x-ray Disposition: Home Diet recommendation:  Discharge Diet Orders (From admission, onward)     Start     Ordered   07/19/23 0000  Diet - low sodium heart healthy        07/19/23 1219           Regular diet DISCHARGE MEDICATION: Allergies as of 07/19/2023   No Known Allergies      Medication List     STOP taking these medications    ENDARI  PO   Oxycodone  HCl 10 MG Tabs       TAKE these medications    acetaminophen  500 MG tablet Commonly known as: TYLENOL  Take 2 tablets (1,000 mg total) by mouth every 8 (eight) hours as needed for fever, mild pain (pain score 1-3) or moderate pain (pain score 4-6).   EC-Naproxen 500 MG EC tablet Generic drug: naproxen Take 500 mg by mouth 2 (two) times daily as needed (Pain).   hydroxyurea  500 MG capsule Commonly known as: HYDREA  Take 500 mg by  mouth 3 (three) times daily.   ibuprofen  600 MG tablet Commonly known as: IBU Take 1 tablet (600 mg total) by mouth every 8 (eight) hours as needed for moderate pain (pain score 4-6).   ondansetron  4 MG disintegrating tablet Commonly known as: ZOFRAN -ODT 4mg  ODT q4 hours prn nausea/vomit What changed:  how much to take how to take this when to take this reasons to take this additional instructions   oxyCODONE -acetaminophen  5-325 MG tablet Commonly known as: Percocet Take 1 tablet by mouth every 6 (six) hours as needed.   penicillin  v potassium 250 MG tablet Commonly known as: VEETID Take 250 mg by mouth 2 times daily at 12 noon and 4 pm.        Discharge Exam: There were no vitals filed for this visit. Constitutional: NAD, calm, comfortable Eyes: PERRL, lids and conjunctivae normal ENMT: Mucous membranes are moist. Posterior pharynx clear of any exudate or lesions.Normal dentition.  Neck: normal, supple, no masses, no thyromegaly Respiratory: clear to auscultation bilaterally, no wheezing, no crackles. Normal respiratory effort. No accessory muscle use.  Cardiovascular: Regular rate and rhythm, no murmurs / rubs / gallops. No extremity edema. 2+ pedal pulses. No carotid bruits.  Abdomen: no tenderness, no masses palpated. No hepatosplenomegaly. Bowel sounds positive.  Musculoskeletal: Good range of motion, no joint swelling or tenderness, Skin: no rashes, lesions,  ulcers. No induration Neurologic: CN 2-12 grossly intact. Sensation intact, DTR normal. Strength 5/5 in all 4.  Psychiatric: Normal judgment and insight. Alert and oriented x 3. Normal mood   Condition at discharge: good  The results of significant diagnostics from this hospitalization (including imaging, microbiology, ancillary and laboratory) are listed below for reference.   Imaging Studies: CT Renal Stone Study Result Date: 07/14/2023 CLINICAL DATA:  Abdominal/flank pain, stone suspected Sickle cell  pain.  Low back pain.  No fever or cough. EXAM: CT ABDOMEN AND PELVIS WITHOUT CONTRAST TECHNIQUE: Multidetector CT imaging of the abdomen and pelvis was performed following the standard protocol without IV contrast. RADIATION DOSE REDUCTION: This exam was performed according to the departmental dose-optimization program which includes automated exposure control, adjustment of the mA and/or kV according to patient size and/or use of iterative reconstruction technique. COMPARISON:  Chest CTA 11/26/2022. FINDINGS: Lower chest: Stable mild scarring at both lung bases. No significant pleural or pericardial effusion. Hepatobiliary: No focal hepatic abnormalities on noncontrast imaging. There is no evidence of significant biliary dilatation status post cholecystectomy. Pancreas: Unremarkable. No pancreatic ductal dilatation or surrounding inflammatory changes. 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 Adrenals/Urinary Tract: Both adrenal glands appear normal. No evidence of urinary tract calculus, suspicious renal lesion or hydronephrosis. The bladder appears unremarkable for its degree of distention. Stomach/Bowel: No enteric contrast administered. The stomach appears unremarkable for its degree of distension. No evidence of bowel wall thickening, distention or surrounding inflammatory change. Retrocecal surgical clips suggesting previous appendectomy. Mildly prominent stool throughout the colon. Vascular/Lymphatic: There are no enlarged abdominal or pelvic lymph nodes. No significant vascular findings on noncontrast imaging. Reproductive: The uterus and ovaries appear unremarkable. No evidence of adnexal mass. Other: No evidence of abdominal wall mass or hernia. No ascites or pneumoperitoneum. 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 Additional bone infarcts are noted within the bony pelvis and thoracolumbar spine. No evidence of acute fracture.  IMPRESSION: 1. No acute findings or explanation for the patient's symptoms. No evidence of urinary tract calculus or hydronephrosis. 2. Mildly prominent stool throughout the colon suggesting constipation. 3. 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 Additional bone infarcts within the bony pelvis and thoracolumbar spine attributed to underlying sickle cell. 4. Postsurgical changes as described. Electronically Signed   By: Elmon Hagedorn M.D.   On: 07/14/2023 21:17    Microbiology: Results for orders placed or performed during the hospital encounter of 05/04/23  Resp panel by RT-PCR (RSV, Flu A&B, Covid) Anterior Nasal Swab     Status: None   Collection Time: 05/04/23 11:55 AM   Specimen: Anterior Nasal Swab  Result Value Ref Range Status   SARS Coronavirus 2 by RT PCR NEGATIVE NEGATIVE Final    Comment: (NOTE) SARS-CoV-2 target nucleic acids are NOT DETECTED.  The SARS-CoV-2 RNA is generally detectable in upper respiratory specimens during the acute phase of infection. The lowest concentration of SARS-CoV-2 viral copies this assay can detect is 138 copies/mL. A negative result does not preclude SARS-Cov-2 infection and should not be used as the sole basis for treatment or other patient management decisions. A negative result may occur with  improper specimen collection/handling, submission of specimen other than nasopharyngeal swab, presence of viral mutation(s) within the areas targeted by this assay, and inadequate number of viral copies(<138 copies/mL). A negative result must be combined with clinical observations, patient history, and epidemiological information. The expected result is Negative.  Fact Sheet for Patients:  BloggerCourse.com  Fact Sheet for Healthcare Providers:  SeriousBroker.it  This test is no t yet approved or cleared by the [place]  FDA and  has been authorized for detection and/or  diagnosis of SARS-CoV-2 by FDA under an Emergency Use Authorization (EUA). This EUA will remain  in effect (meaning this test can be used) for the duration of the COVID-19 declaration under Section 564(b)(1) of the Act, 21 U.S.C.section 360bbb-3(b)(1), unless the authorization is terminated  or revoked sooner.       Influenza A by PCR NEGATIVE NEGATIVE Final   Influenza B by PCR NEGATIVE NEGATIVE Final    Comment: (NOTE) The Xpert Xpress SARS-CoV-2/FLU/RSV plus assay is intended as an aid in the diagnosis of influenza from Nasopharyngeal swab specimens and should not be used as a sole basis for treatment. Nasal washings and aspirates are unacceptable for Xpert Xpress SARS-CoV-2/FLU/RSV testing.  Fact Sheet for Patients: BloggerCourse.com  Fact Sheet for Healthcare Providers: SeriousBroker.it  This test is not yet approved or cleared by the [place]  FDA and has been authorized for detection and/or diagnosis of SARS-CoV-2 by FDA under an Emergency Use Authorization (EUA). This EUA will remain in effect (meaning this test can be used) for the duration of the COVID-19 declaration under Section 564(b)(1) of the Act, 21 U.S.C. section 360bbb-3(b)(1), unless the authorization is terminated or revoked.     Resp Syncytial Virus by PCR NEGATIVE NEGATIVE Final    Comment: (NOTE) Fact Sheet for Patients: BloggerCourse.com  Fact Sheet for Healthcare Providers: SeriousBroker.it  This test is not yet approved or cleared by the [place]  FDA and has been authorized for detection and/or diagnosis of SARS-CoV-2 by FDA under an Emergency Use Authorization (EUA). This EUA will remain in effect (meaning this test can be used) for the duration of the COVID-19 declaration under Section 564(b)(1) of the Act, 21 U.S.C. section 360bbb-3(b)(1), unless the authorization is terminated  or revoked.  Performed at [facility], [address] [place], [place] [address]   Group A Strep by PCR     Status: None   Collection Time: 05/04/23  1:12 PM  Result Value Ref Range Status   Group A Strep by PCR NOT DETECTED NOT DETECTED Final    Comment: Performed at [facility], [address] [place], [place] [address]    Labs: CBC: Recent Labs  Lab 07/18/23 1627 07/19/23 0509  WBC 10.7* 10.9*  HGB 8.9* 8.5*  HCT 25.5* 24.4*  MCV 89.2 89.4  PLT 339 346   Basic Metabolic Panel: Recent Labs  Lab 07/17/23 0430 07/18/23 0406 07/19/23 0509  NA 137 134* 135  K 3.5 3.0* 3.4*  CL 103 100 101  CO2 20* 26 28  GLUCOSE 77 87 112*  BUN <5* 6 6  CREATININE 0.40* 0.49 0.51  CALCIUM 9.2 8.9 9.2  MG 1.9  --   --    Liver Function Tests: No results for input(s): "AST", "ALT", "ALKPHOS", "BILITOT", "PROT", "ALBUMIN" in the last 168 hours.  CBG: No results for input(s): "GLUCAP" in the last 168 hours.  Discharge time spent: greater than 30 minutes.  SignedCarolin Chyle, MD [facility] 07/23/2023

## 2023-07-23 NOTE — Hospital Course (Signed)
 Patient admitted with sickle cell painful crisis.  She also has functional asplenia and anemia of chronic disease.  Treated with Dilaudid  PCA, Toradol , IV fluids.  Pain improved.  Controlled and down to her baseline.  Patient requested home regimen which was given.  At the time of discharge she is back to baseline now and discharged home to follow-up with her PCP.

## 2023-12-13 NOTE — Telephone Encounter (Signed)
 Regarding: sickle cell pt with left leg pain ----- Message from [facility] sent at 12/13/2023  1:00 PM EDT ----- Clinic Name: Peds HemOnc  // Dr Marolyn Grayson Patient Caller/Relationship: Self  Reason for call:  sickle cell . left leg pain . requesting medication Additional comment:

## 2023-12-13 NOTE — Telephone Encounter (Addendum)
 Reason for Conversation: Sickle Cell Pain  Current Symptoms: SS related left leg pain   Home Care Tried & If Effective: : Naproxen, Ibuprofen  - no improvement - hydroxyurea  1500 mg daily and Endari  15 g BID.   Denies: dehydration, fever, swelling  Pt identifiers x2 confirmed.   HIPAA confirmed via EMR.   Mom/Shanise reports Pt with SS related left leg pain, onset AM 12-13-23. OTC Naproxen, Motrin  not helping pain, requesting Oxycodone  10mg  refill.   Mom confirms Pt is having more frequent [place] crisis related to seasonal changes.    OV 11-20-23 - Dr Marolyn Bare  SS disease w/ crisis.  Preferred pharmacy [facility].    RN used Pediatric Hem/Onc Sickle Cell protocol, consulted with Oncologist: Lesley Russel, MD, MD will send in Oxycodone  [facility], encouraged Mom to push fluids, monitor for fevers, reminded Mom of next Hem/Onc appt 02-19-24, follow [facility] with [facility] for [facility] pick [facility] and to call back as needed.  Mom verbalized understanding and will follow care advice.   Per EMR, Dr Bare note, 11-20-23: February 2025: One ED visit for a pain crisis  March 2025: Back-to-back ED visits on 05/04/23 and 05/05/23 for viral illness and ED visit. Pain in right leg on 05/20/23; managed at home.  May 2025: Admission for a pain crisis.    1. REASON FOR CALL: What is your main concern right now? [place] Crisis left leg pain, onset AM 12-13-23. OTC Naproxen, Motrin  not helping pain, requesting Oxycodone  10mg  refill.   Mom confirms Pt is having more frequent [place] crisis related to seasonal changes.    OV 11-20-23 - Dr Marolyn Bare   6. FEVER: Do you have a fever? no 7. OTHER SYMPTOMS: Do you have any other new symptoms? no 8. TREATMENTS AND RESPONSE: What have you done so far to try to make this better? What medicines have you used? Naproxen, Ibuprofen  - no improvement - hydroxyurea  1500 mg daily and Endari  15 g BID.   General Assessment     Do you have pain:  Do you have any pain?: Yes    What are the alleviating or aggravating factors?:  (Comment: Pt not with Mom for pain level)         Protocols Used: No Guideline Available-Adult-AH  No Additional Information on file.  Disposition: Call PCP Now   Reason for Disposition: .  Nursing judgment or information in reference       General Assessment     Do you have pain:  Do you have any pain?: Yes   What are the alleviating or aggravating factors?:  (Comment: Pt not with Mom for pain level)         Disposition  Call PCP Now  Reason for Disposition   .  Nursing judgment or information in reference  1. REASON FOR CALL: What is your main concern right now? [place] Crisis left leg pain, onset AM 12-13-23. OTC Naproxen, Motrin  not helping pain, requesting Oxycodone  10mg  refill.   Mom confirms Pt is having more frequent  crisis related to seasonal changes.    OV 11-20-23 - Dr Marolyn Bare   6. FEVER: Do you have a fever? no 7. OTHER SYMPTOMS: Do you have any other new symptoms? no 8. TREATMENTS AND RESPONSE: What have you done so far to try to make this better? What medicines have you used? Naproxen, Ibuprofen  - no improvement - hydroxyurea  1500 mg daily and Endari  15 g BID.  No Additional Information on file.  Protocols Used    No Guideline Available-Adult-AH

## 2023-12-14 ENCOUNTER — Emergency Department (HOSPITAL_COMMUNITY)
Admission: EM | Admit: 2023-12-14 | Discharge: 2023-12-14 | Disposition: A | Discharge status: Home / Self Care | Attending: Emergency Medicine | Admitting: Emergency Medicine

## 2023-12-14 ENCOUNTER — Emergency Department (HOSPITAL_COMMUNITY)

## 2023-12-14 DIAGNOSIS — D5700 Hb-SS disease with crisis, unspecified: Secondary | ICD-10-CM | POA: Insufficient documentation

## 2023-12-14 LAB — CBC WITH DIFFERENTIAL/PLATELET
Abs Immature Granulocytes: 0.03 K/uL (ref 0.00–0.07)
Basophils Absolute: 0.1 K/uL (ref 0.0–0.1)
Basophils Relative: 1 %
Eosinophils Absolute: 0.3 K/uL (ref 0.0–0.5)
Eosinophils Relative: 4 %
HCT: 27.7 % — ABNORMAL LOW (ref 36.0–46.0)
Hemoglobin: 9.5 g/dL — ABNORMAL LOW (ref 12.0–15.0)
Immature Granulocytes: 0 %
Lymphocytes Relative: 34 %
Lymphs Abs: 2.5 K/uL (ref 0.7–4.0)
MCH: 30.3 pg (ref 26.0–34.0)
MCHC: 34.3 g/dL (ref 30.0–36.0)
MCV: 88.2 fL (ref 80.0–100.0)
Monocytes Absolute: 0.8 K/uL (ref 0.1–1.0)
Monocytes Relative: 11 %
Neutro Abs: 3.7 K/uL (ref 1.7–7.7)
Neutrophils Relative %: 50 %
Platelets: 474 K/uL — ABNORMAL HIGH (ref 150–400)
RBC: 3.14 MIL/uL — ABNORMAL LOW (ref 3.87–5.11)
RDW: 14.5 % (ref 11.5–15.5)
WBC: 7.4 K/uL (ref 4.0–10.5)
nRBC: 0 % (ref 0.0–0.2)

## 2023-12-14 LAB — RETICULOCYTES
Immature Retic Fract: 25.8 % — ABNORMAL HIGH (ref 2.3–15.9)
RBC.: 3.12 MIL/uL — ABNORMAL LOW (ref 3.87–5.11)
Retic Count, Absolute: 136.3 K/uL (ref 19.0–186.0)
Retic Ct Pct: 4.4 % — ABNORMAL HIGH (ref 0.4–3.1)

## 2023-12-14 LAB — COMPREHENSIVE METABOLIC PANEL WITH GFR
ALT: 14 U/L (ref 0–44)
AST: 43 U/L — ABNORMAL HIGH (ref 15–41)
Albumin: 4.9 g/dL (ref 3.5–5.0)
Alkaline Phosphatase: 37 U/L — ABNORMAL LOW (ref 38–126)
Anion gap: 11 (ref 5–15)
BUN: 7 mg/dL (ref 6–20)
CO2: 25 mmol/L (ref 22–32)
Calcium: 9.8 mg/dL (ref 8.9–10.3)
Chloride: 104 mmol/L (ref 98–111)
Creatinine, Ser: 0.67 mg/dL (ref 0.44–1.00)
GFR, Estimated: >60 mL/min (ref >60)
Glucose, Bld: 77 mg/dL (ref 70–99)
Potassium: 4.3 mmol/L (ref 3.5–5.1)
Sodium: 140 mmol/L (ref 135–145)
Total Bilirubin: 1.4 mg/dL — ABNORMAL HIGH (ref 0.0–1.2)
Total Protein: 8 g/dL (ref 6.5–8.1)

## 2023-12-14 MED ORDER — HYDROCODONE-ACETAMINOPHEN 5-325 MG PO TABS: ORAL_TABLET | ORAL | 0 refills | Status: AC

## 2023-12-14 MED ORDER — ONDANSETRON HCL 4 MG/2ML IJ SOLN
4.0000 mg | INTRAMUSCULAR | Status: DC | PRN
  Administered 2023-12-14: 4 mg via INTRAVENOUS
  Filled 2023-12-14: qty 2

## 2023-12-14 MED ORDER — OXYCODONE HCL 5 MG PO TABS
5.0000 mg | ORAL_TABLET | Freq: Once | ORAL | Status: AC
  Administered 2023-12-14: 5 mg via ORAL
  Filled 2023-12-14: qty 1

## 2023-12-14 MED ORDER — HYDROMORPHONE HCL 1 MG/ML IJ SOLN
0.5000 mg | INTRAMUSCULAR | Status: AC
  Administered 2023-12-14: 0.5 mg via INTRAVENOUS
  Filled 2023-12-14: qty 1

## 2023-12-14 MED ORDER — DIPHENHYDRAMINE HCL 25 MG PO CAPS
25.0000 mg | ORAL_CAPSULE | ORAL | Status: DC | PRN
  Administered 2023-12-14: 50 mg via ORAL
  Filled 2023-12-14: qty 2

## 2023-12-14 MED ORDER — MORPHINE SULFATE (PF) 4 MG/ML IV SOLN
8.0000 mg | Freq: Once | INTRAVENOUS | Status: AC
  Administered 2023-12-14: 8 mg via INTRAVENOUS
  Filled 2023-12-14: qty 2

## 2023-12-14 MED ORDER — HYDROMORPHONE HCL 1 MG/ML IJ SOLN
1.0000 mg | INTRAMUSCULAR | Status: AC
  Administered 2023-12-14: 1 mg via INTRAVENOUS
  Filled 2023-12-14: qty 1

## 2023-12-14 MED ORDER — KETOROLAC TROMETHAMINE 15 MG/ML IJ SOLN
15.0000 mg | INTRAMUSCULAR | Status: AC
Start: 2023-12-14 — End: 2023-12-14
  Administered 2023-12-14: 15 mg via INTRAVENOUS
  Filled 2023-12-14: qty 1

## 2023-12-14 MED ORDER — HYDROMORPHONE HCL 1 MG/ML IJ SOLN: 1.0000 mg | INTRAMUSCULAR | Status: DC

## 2023-12-14 NOTE — ED Provider Notes (Signed)
 [place] EMERGENCY DEPARTMENT AT [facility] Provider Note   CSN: [id number] Arrival date & time: 12/14/23  1132     Patient presents with: Sickle Cell Pain Crisis   Janet Stone is a 18 y.o. female.  {Add pertinent medical, surgical, social history, OB history to HPI:32973} HPI    18 year old female comes in with chief complaint of sickle cell pain.  Patient has history of sickle cell anemia, she has been taking Tylenol , ibuprofen  for her pain.  Currently she is not on any oxycodone .  Patient states that her current pain is in the left leg, which is typical for her sickle cell pain as far as location is concerned.  Pain is described as sharp, dull and is similar to the sickle cell pain.  Patient suspects that the weather change could be the cause.  She denies any recent illnesses.  She has no cough, fevers, chills, UTI-like symptoms.  Patient does not think she is pregnant.  She denies dehydration.  Prior to Admission medications   Medication Sig Start Date End Date Taking? Authorizing Provider  acetaminophen  (TYLENOL ) 500 MG tablet Take 2 tablets (1,000 mg total) by mouth every 8 (eight) hours as needed for fever, mild pain (pain score 1-3) or moderate pain (pain score 4-6). 05/04/23   Nivia Colon, PA-C  EC-NAPROXEN 500 MG EC tablet Take 500 mg by mouth 2 (two) times daily as needed (Pain). 05/03/23   [provider]  hydroxyurea  (HYDREA ) 500 MG capsule Take 500 mg by mouth 3 (three) times daily. 06/13/17   [provider]  ibuprofen  (IBU) 600 MG tablet Take 1 tablet (600 mg total) by mouth every 8 (eight) hours as needed for moderate pain (pain score 4-6). 04/16/23   Randol Simmonds, MD  ondansetron  (ZOFRAN -ODT) 4 MG disintegrating tablet 4mg  ODT q4 hours prn nausea/vomit Patient taking differently: Take 4 mg by mouth every 8 (eight) hours as needed for nausea, vomiting or refractory nausea / vomiting. 05/05/23   Zammit, Joseph, MD  oxyCODONE -acetaminophen   (PERCOCET) 5-325 MG tablet Take 1 tablet by mouth every 6 (six) hours as needed. 07/19/23   Sim Emery CROME, MD  penicillin  v potassium (VEETID) 250 MG tablet Take 250 mg by mouth 2 times daily at 12 noon and 4 pm. 04/10/16   [provider]    Allergies: Patient has no known allergies.    Review of Systems  All other systems reviewed and are negative.   Updated Vital Signs BP (!) 109/58 (BP Location: Left Arm)   Pulse 70   Temp 98.4 F (36.9 C) (Oral)   Resp 15   SpO2 100%   Physical Exam Vitals and nursing note reviewed.  Constitutional:      Appearance: She is well-developed.  HENT:     Head: Atraumatic.  Eyes:     Extraocular Movements: Extraocular movements intact.     Pupils: Pupils are equal, round, and reactive to light.  Cardiovascular:     Rate and Rhythm: Normal rate.  Pulmonary:     Effort: Pulmonary effort is normal.  Musculoskeletal:     Cervical back: Normal range of motion and neck supple.  Skin:    General: Skin is warm and dry.  Neurological:     Mental Status: She is alert and oriented to person, place, and time.     (all labs ordered are listed, but only abnormal results are displayed) Labs Reviewed  CBC WITH DIFFERENTIAL/PLATELET  RETICULOCYTES  COMPREHENSIVE METABOLIC PANEL WITH GFR  EKG: None  Radiology: No results found.  {Document cardiac monitor, telemetry assessment procedure when appropriate:32947} Procedures   Medications Ordered in the ED  ketorolac  (TORADOL ) 15 MG/ML injection 15 mg (has no administration in time range)  HYDROmorphone  (DILAUDID ) injection 0.5 mg (has no administration in time range)  HYDROmorphone  (DILAUDID ) injection 1 mg (has no administration in time range)  HYDROmorphone  (DILAUDID ) injection 1 mg (has no administration in time range)  diphenhydrAMINE  (BENADRYL ) capsule 25-50 mg (has no administration in time range)  ondansetron  (ZOFRAN ) injection 4 mg (has no administration in time range)       {Click here for ABCD2, HEART and other calculators REFRESH Note before signing:1}                              Medical Decision Making Risk Prescription drug management.   Pt comes in with sickle cell related pain.  Patient has history of sickle cell anemia with complications from it including vaso-occlusive crisis in the past. We have reviewed patient's previous ED visits, recent lab results and recent imaging results for this encounter.  Differential diagnosis includes vaso-occlusive pain, occult infection, dehydration, electrolyte abnormality, hyperbilirubinemia, aplastic anemia.  VSS and WNL -  hemodynamically stable.  History and exam is not indicative of any specific source of infection at this time.  Pain appears vaso-occlusive type and typical of previous pain. Appropriate labs ordered.  Pain control and symptom management initiated while in the ED with parenteral medication. We will reassess patient after multiple doses of analgesics. Goal is to break the pain and see if patient feels comfortable going home.  Currently, there is no signs of severe decompensation clinically. Will continue to monitor closely.  Admission has been considered for this patient, and we will proceed with this request If we are unable to control the pain, we will admit the patient.   Final diagnoses:  Sickle cell pain crisis [facility])    ED Discharge Orders     None

## 2023-12-14 NOTE — ED Triage Notes (Addendum)
 Left leg pain starting yesterday - worse today. Tried home meds including tylenol  and naproxen without much relief. Denies CP/SOB

## 2023-12-14 NOTE — Discharge Instructions (Signed)
Follow up with your md this week. °

## 2023-12-14 NOTE — ED Provider Notes (Signed)
 Patient feeling better from sick cell crisis.  She wants some pain medicine prescription that she is given Vicodin.  Patient does not want to get her venous ultrasound so that we will be canceled.  Patient will follow-up with PCP   Suzette Pac, MD 12/14/23 1718

## 2024-03-15 ENCOUNTER — Other Ambulatory Visit: Payer: Self-pay

## 2024-03-15 ENCOUNTER — Encounter (HOSPITAL_COMMUNITY): Payer: Self-pay

## 2024-03-15 ENCOUNTER — Emergency Department (HOSPITAL_COMMUNITY): Admission: EM | Admit: 2024-03-15 | Discharge: 2024-03-15 | Discharge status: Against Medical Advice

## 2024-03-15 DIAGNOSIS — D57219 Sickle-cell/Hb-C disease with crisis, unspecified: Secondary | ICD-10-CM | POA: Insufficient documentation

## 2024-03-15 DIAGNOSIS — Z5321 Procedure and treatment not carried out due to patient leaving prior to being seen by health care provider: Secondary | ICD-10-CM | POA: Insufficient documentation

## 2024-03-15 MED ORDER — OXYCODONE HCL 5 MG PO TABS
5.0000 mg | ORAL_TABLET | Freq: Once | ORAL | Status: AC
  Administered 2024-03-15: 5 mg via ORAL
  Filled 2024-03-15: qty 1

## 2024-03-15 MED ORDER — ONDANSETRON 4 MG PO TBDP
4.0000 mg | ORAL_TABLET | Freq: Once | ORAL | Status: AC
  Administered 2024-03-15: 4 mg via ORAL
  Filled 2024-03-15: qty 1

## 2024-03-15 NOTE — ED Provider Triage Note (Signed)
 Emergency Medicine Provider Triage Evaluation Note  Janet Stone , a 19 y.o. female  was evaluated in triage.  Pt complains of pain consistent with sickle cell pain crisis in her left arm, patient also states she has had abdominal pain, nausea, and vomiting. Denies chest pain or shortness of breath. Denies fevers. States that pain in left arm is consistent with previous sickle cell pain. Denies trauma/injury. Denies alcohol or drug use other than marijuana. Patient is out of oxycodone  at home, taking naproxen and tylenol  for pain.  Review of Systems  Positive: Left arm pain, abdominal pain, nausea, vomiting Negative: Fever, chest pain, shortness of breath  Physical Exam  BP 117/63 (BP Location: Left Arm)   Pulse 88   Temp 98 F (36.7 C) (Oral)   Resp 16   Ht 5' 2 (1.575 m)   Wt 59.4 kg   SpO2 100%   BMI 23.96 kg/m  Gen:   Awake, no distress   Resp:  Normal effort  MSK:   Moves extremities without difficulty  Other:  Abdomen soft and non-tender, normal ROM of left arm Medical Decision Making  Medically screening exam initiated at 10:49 AM.  Appropriate orders placed.  Janet Stone was informed that the remainder of the evaluation will be completed by another provider, this initial triage assessment does not replace that evaluation, and the importance of remaining in the ED until their evaluation is complete.  Orders: CBC, CMP, lipase, UA, pregnancy, reticulocytes, zofran , oxycodone    Janetta Terrall FALCON, PA-C 03/15/24 1052

## 2024-03-15 NOTE — ED Triage Notes (Signed)
 Patient is having a sickle cell pain crisis since this morning. Pain is in left arm and generalized abdominal pain. Vomiting yellow in color. No diarrhea.

## 2024-04-07 ENCOUNTER — Emergency Department (HOSPITAL_COMMUNITY)

## 2024-04-07 ENCOUNTER — Emergency Department (HOSPITAL_COMMUNITY)
Admission: EM | Admit: 2024-04-07 | Discharge: 2024-04-07 | Disposition: A | Discharge status: Home / Self Care | Attending: Emergency Medicine | Admitting: Emergency Medicine

## 2024-04-07 DIAGNOSIS — D5700 Hb-SS disease with crisis, unspecified: Secondary | ICD-10-CM | POA: Insufficient documentation

## 2024-04-07 LAB — CBC WITH DIFFERENTIAL/PLATELET
Abs Immature Granulocytes: 0.04 10*3/uL (ref 0.00–0.07)
Basophils Absolute: 0.1 10*3/uL (ref 0.0–0.1)
Basophils Relative: 1 %
Eosinophils Absolute: 0.2 10*3/uL (ref 0.0–0.5)
Eosinophils Relative: 2 %
HCT: 25.8 % — ABNORMAL LOW (ref 36.0–46.0)
Hemoglobin: 8.8 g/dL — ABNORMAL LOW (ref 12.0–15.0)
Immature Granulocytes: 0 %
Lymphocytes Relative: 17 %
Lymphs Abs: 1.8 10*3/uL (ref 0.7–4.0)
MCH: 30.7 pg (ref 26.0–34.0)
MCHC: 34.1 g/dL (ref 30.0–36.0)
MCV: 89.9 fL (ref 80.0–100.0)
Monocytes Absolute: 1.1 10*3/uL — ABNORMAL HIGH (ref 0.1–1.0)
Monocytes Relative: 11 %
Neutro Abs: 7.3 10*3/uL (ref 1.7–7.7)
Neutrophils Relative %: 69 %
Platelets: 355 10*3/uL (ref 150–400)
RBC: 2.87 MIL/uL — ABNORMAL LOW (ref 3.87–5.11)
RDW: 14.3 % (ref 11.5–15.5)
WBC: 10.5 10*3/uL (ref 4.0–10.5)
nRBC: 0 % (ref 0.0–0.2)

## 2024-04-07 LAB — COMPREHENSIVE METABOLIC PANEL WITH GFR
ALT: 12 U/L (ref 0–44)
AST: 30 U/L (ref 15–41)
Albumin: 4.3 g/dL (ref 3.5–5.0)
Alkaline Phosphatase: 32 U/L — ABNORMAL LOW (ref 38–126)
Anion gap: 12 (ref 5–15)
BUN: 7 mg/dL (ref 6–20)
CO2: 22 mmol/L (ref 22–32)
Calcium: 9.4 mg/dL (ref 8.9–10.3)
Chloride: 103 mmol/L (ref 98–111)
Creatinine, Ser: 0.53 mg/dL (ref 0.44–1.00)
GFR, Estimated: >60 mL/min
Glucose, Bld: 75 mg/dL (ref 70–99)
Potassium: 3.9 mmol/L (ref 3.5–5.1)
Sodium: 138 mmol/L (ref 135–145)
Total Bilirubin: 1.7 mg/dL — ABNORMAL HIGH (ref 0.0–1.2)
Total Protein: 7.3 g/dL (ref 6.5–8.1)

## 2024-04-07 LAB — RESP PANEL BY RT-PCR (RSV, FLU A&B, COVID)  RVPGX2
Influenza A by PCR: NEGATIVE
Influenza B by PCR: NEGATIVE
Resp Syncytial Virus by PCR: NEGATIVE
SARS Coronavirus 2 by RT PCR: NEGATIVE

## 2024-04-07 LAB — RETICULOCYTES
Immature Retic Fract: 9 % (ref 2.3–15.9)
RBC.: 0.54 MIL/uL — ABNORMAL LOW (ref 3.87–5.11)
Retic Count, Absolute: 21.8 10*3/uL (ref 19.0–186.0)
Retic Ct Pct: 4 % — ABNORMAL HIGH (ref 0.4–3.1)

## 2024-04-07 MED ORDER — KETOROLAC TROMETHAMINE 30 MG/ML IJ SOLN
30.0000 mg | Freq: Once | INTRAMUSCULAR | Status: AC
  Administered 2024-04-07: 30 mg via INTRAVENOUS
  Filled 2024-04-07: qty 1

## 2024-04-07 MED ORDER — DIPHENHYDRAMINE HCL 50 MG/ML IJ SOLN
12.5000 mg | Freq: Once | INTRAMUSCULAR | Status: AC
  Administered 2024-04-07: 12.5 mg via INTRAVENOUS
  Filled 2024-04-07: qty 1

## 2024-04-07 MED ORDER — MORPHINE SULFATE (PF) 4 MG/ML IV SOLN
6.0000 mg | Freq: Once | INTRAVENOUS | Status: AC
  Administered 2024-04-07: 6 mg via INTRAVENOUS
  Filled 2024-04-07: qty 2

## 2024-04-07 MED ORDER — HYDROMORPHONE HCL 1 MG/ML IJ SOLN
1.0000 mg | Freq: Once | INTRAMUSCULAR | Status: AC
  Administered 2024-04-07: 1 mg via INTRAVENOUS
  Filled 2024-04-07: qty 1

## 2024-04-07 MED ORDER — OXYCODONE HCL 5 MG PO TABS: 10.0000 mg | ORAL_TABLET | ORAL | 0 refills | Status: AC | PRN

## 2024-04-07 MED ORDER — LACTATED RINGERS IV BOLUS
1000.0000 mL | Freq: Once | INTRAVENOUS | Status: AC
  Administered 2024-04-07: 1000 mL via INTRAVENOUS

## 2024-04-07 NOTE — Discharge Instructions (Addendum)
 You were seen in the emergency department today for concerns of sickle cell pain crisis.  Your pain was able to get somewhat under control.  Please take all medications as prescribed.  Follow-up closely with your primary care provider.  For any concerns of new or worsening symptoms, return to the emergency department.

## 2024-04-07 NOTE — ED Notes (Signed)
 US  PIV Placed 22g 2.5 R upper arm.

## 2024-04-07 NOTE — ED Triage Notes (Signed)
 Patient reports sickle cell crisis started last night, abdominal pain/congestion/LLE pain. Deneis SOB/CP. Patient is alert and oriented x 4. Airway patent, respirations even and unlabored. Skin normal, warm and dry.

## 2024-04-07 NOTE — ED Provider Triage Note (Signed)
 Emergency Medicine Provider Triage Evaluation Note  Janet [name] , a 19 y.o. female  was evaluated in triage.  Pt complains of sickle cell pain crisis. States she takes oxycodone  10mg  but no helping at this time. No chest pain or SOB. Pain started in her leg but now has whole bodyaches and a slight cough. No sick contacts. No nausea, vomiting, or diarrhea.  Review of Systems  Positive: As above Negative: As above  Physical Exam  BP 102/69 (BP Location: Left Arm)   Pulse 80   Temp 98.6 F (37 C) (Oral)   Resp 16   LMP 04/05/2024   SpO2 98%  Gen:   Awake, no distress   Resp:  Normal effort  MSK:   Moves extremities without difficulty Other:    Medical Decision Making  Medically screening exam initiated at 3:23 PM.  Appropriate orders placed.  Janet [name] was informed that the remainder of the evaluation will be completed by another provider, this initial triage assessment does not replace that evaluation, and the importance of remaining in the ED until their evaluation is complete.     Markela Wee A, PA-C 04/07/24 1524

## 2024-04-07 NOTE — ED Provider Notes (Signed)
 " [place] EMERGENCY DEPARTMENT AT [facility] Provider Note   CSN: [id number] Arrival date & time: 04/07/24  1434     Patient presents with: Sickle Cell Pain Crisis   Janet [name] is [name] 19 y.o. female.  Patient history significant for sickle cell anemia presents ED with concerns of sickle cell pain crisis.  She states that pain started last night primarily towards the left lower leg has not developed whole body pain.  No reported fever, chills or bodyaches but has had some congestion.  Denies any sick contacts.  No chest pain or shortness of breath.  States she has tried her home dose of oxycodone  10 mg without improvement in pain.   Sickle Cell Pain Crisis      Prior to Admission medications  Medication Sig Start Date End Date Taking? Authorizing Provider  oxyCODONE  (ROXICODONE ) 5 MG immediate release tablet Take 2 tablets (10 mg total) by mouth every 4 (four) hours as needed for severe pain (pain score 7-10). 04/07/24  Yes Janet Sliney [name], Janet [name]  acetaminophen  (TYLENOL ) 500 MG tablet Take 2 tablets (1,000 mg total) by mouth every 8 (eight) hours as needed for fever, mild pain (pain score 1-3) or moderate pain (pain score 4-6). 05/04/23   Janet Colon, Janet [name]  EC-NAPROXEN 500 MG EC tablet Take 500 mg by mouth 2 (two) times daily as needed (Pain). 05/03/23   Provider, Historical, Janet [name]  HYDROcodone -acetaminophen  (NORCO/VICODIN) 5-325 MG tablet Take 1 every 6 hours for pain not relieved by Tylenol  or Motrin  alone. 12/14/23   Janet Pac, Janet [name]  hydroxyurea  (HYDREA ) 500 MG capsule Take 500 mg by mouth 3 (three) times daily. 06/13/17   Provider, Historical, Janet [name]  ibuprofen  (IBU) 600 MG tablet Take 1 tablet (600 mg total) by mouth every 8 (eight) hours as needed for moderate pain (pain score 4-6). 04/16/23   Janet Simmonds, Janet [name]  ondansetron  (ZOFRAN -ODT) 4 MG disintegrating tablet 4mg  ODT q4 hours prn nausea/vomit Patient taking differently: Take 4 mg by mouth every 8 (eight) hours as needed for  nausea, vomiting or refractory nausea / vomiting. 05/05/23   Janet [name], Joseph, Janet [name]  oxyCODONE -acetaminophen  (PERCOCET) 5-325 MG tablet Take 1 tablet by mouth every 6 (six) hours as needed. 07/19/23   Janet Emery CROME, Janet [name]  penicillin  v potassium (VEETID) 250 MG tablet Take 250 mg by mouth 2 times daily at 12 noon and 4 pm. 04/10/16   Provider, Historical, Janet [name]    Allergies: Patient has no known allergies.    Review of Systems  Musculoskeletal:        Pain  All other systems reviewed and are negative.   Updated Vital Signs BP 114/68 (BP Location: Right Arm)   Pulse 68   Temp 98.1 F (36.7 C) (Oral)   Resp 18   LMP 04/05/2024   SpO2 99%   Physical Exam Vitals and nursing note reviewed.  Constitutional:      General: She is not in acute distress.    Appearance: She is well-developed.  HENT:     Head: Normocephalic and atraumatic.  Eyes:     Conjunctiva/sclera: Conjunctivae normal.  Cardiovascular:     Rate and Rhythm: Normal rate and regular rhythm.     Heart sounds: No murmur heard. Pulmonary:     Effort: Pulmonary effort is normal. No respiratory distress.     Breath sounds: Normal breath sounds.  Abdominal:     Palpations: Abdomen is soft.     Tenderness: There is no abdominal tenderness.  Musculoskeletal:        General: No swelling, tenderness or deformity. Normal range of motion.     Cervical back: Neck supple.  Skin:    General: Skin is warm and dry.     Capillary Refill: Capillary refill takes less than 2 seconds.  Neurological:     Mental Status: She is alert.  Psychiatric:        Mood and Affect: Mood normal.     (all labs ordered are listed, but only abnormal results are displayed) Labs Reviewed  CBC WITH DIFFERENTIAL/PLATELET - Abnormal; Notable for the following components:      Result Value   RBC 2.87 (*)    Hemoglobin 8.8 (*)    HCT 25.8 (*)    Monocytes Absolute 1.1 (*)    All other components within normal limits  COMPREHENSIVE METABOLIC PANEL WITH  GFR - Abnormal; Notable for the following components:   Alkaline Phosphatase 32 (*)    Total Bilirubin 1.7 (*)    All other components within normal limits  RETICULOCYTES - Abnormal; Notable for the following components:   Retic Ct Pct 4.0 (*)    RBC. 0.54 (*)    All other components within normal limits  RESP PANEL BY RT-PCR (RSV, FLU [name]&B, COVID)  [id number]    EKG: None  Radiology: DG Chest Portable 1 View Result Date: 04/07/2024 CLINICAL DATA:  Sickle cell crisis.  Chest pain. EXAM: PORTABLE CHEST - 1 VIEW COMPARISON:  05/05/2023 FINDINGS: Cardiomediastinal silhouette and pulmonary vasculature are within normal limits. Lungs are clear.  Multiple surgical clips seen in the upper abdomen. IMPRESSION: No acute cardiopulmonary process. Electronically Signed   By: Janet Lloyd M.D.   On: 04/07/2024 15:43     Procedures   Medications Ordered in the ED  ketorolac  (TORADOL ) 30 MG/ML injection 30 mg (30 mg Intravenous Given 04/07/24 1637)  HYDROmorphone  (DILAUDID ) injection 1 mg (1 mg Intravenous Given 04/07/24 1638)  diphenhydrAMINE  (BENADRYL ) injection 12.5 mg (12.5 mg Intravenous Given 04/07/24 1639)  lactated ringers  bolus 1,000 mL (0 mLs Intravenous Stopped 04/07/24 2043)  HYDROmorphone  (DILAUDID ) injection 1 mg (1 mg Intravenous Given 04/07/24 1808)  diphenhydrAMINE  (BENADRYL ) injection 12.5 mg (12.5 mg Intravenous Given 04/07/24 1808)  morphine  (PF) 4 MG/ML injection 6 mg (6 mg Intravenous Given 04/07/24 1942)                                    Medical Decision Making Amount and/or Complexity of Data Reviewed Labs: ordered. Radiology: ordered.  Risk Prescription drug management.   This patient presents to the ED for concern of sickle cell pain crisis.  Differential diagnosis includes sickle cell anemia with pain crisis, dehydration, acute chest syndrome, pneumonia    Additional history obtained:  Additional history obtained from chart review   Lab Tests:  I Ordered, and  personally interpreted labs.  The pertinent results include: CBC with stable hemoglobin at 8.8, reticulocyte count consistent with baseline, CMP unremarkable, respiratory panel negative   Imaging Studies ordered:  I ordered imaging studies including chest x-ray I independently visualized and interpreted imaging which showed no acute cardiopulmonary process I agree with the radiologist interpretation   Medicines ordered and prescription drug management:  I ordered medication including Dilaudid , Benadryl , fluids, Toradol  for pain, medication reaction, hydration Reevaluation of the patient after these medicines showed that the patient improved I have reviewed the patients home medicines and have made adjustments  as needed   Problem List / ED Course:  Patient presents to the emergency department today with concerns of sickle cell pain crisis.  Versus pain to the left lower leg with the whole body pain developing.  Initially had some abdominal pain but this is resolved.  No fever but does endorse some feelings of bodyaches.  Has [name] slight cough.  No sick contacts. Exam is unremarkable. Abdomen is soft and non-tender. No abnormal heart or lung sounds. Labs reassuring and baseline for patient. Initial round of pain medications ordered and will reassess shortly. Patient reassessed and pain appears to have improved from 9 to 7-8 per her report. Will try repeat dose of pain medications and 12.5 mg Benadryl . After third round of pain medications, patient reports pain is tolerable.  Will discharge home with small supply of home medication.  Vies close follow-up with PCP for further refills and further evaluation.  Return precautions discussed.  Discharged home in stable condition.   Social Determinants of Health:  None  Final diagnoses:  Sickle cell pain crisis [facility])    ED Discharge Orders          Ordered    oxyCODONE  (ROXICODONE ) 5 MG immediate release tablet  Every 4 hours PRN         04/07/24 2025               Janet Collington [name], Janet [name] 04/07/24 2048    Janet Jerilynn RAMAN, Janet [name] 04/07/24 2140  "
